# Patient Record
Sex: Male | Born: 1935 | Race: White | Hispanic: No | Marital: Married | Smoking: Never smoker
Health system: Southern US, Community
[De-identification: ages and names within clinical notes are randomized; demographics above are authoritative.]

## PROBLEM LIST (undated history)

## (undated) DIAGNOSIS — F0390 Unspecified dementia without behavioral disturbance: Secondary | ICD-10-CM

## (undated) DIAGNOSIS — K219 Gastro-esophageal reflux disease without esophagitis: Secondary | ICD-10-CM

## (undated) DIAGNOSIS — K56609 Unspecified intestinal obstruction, unspecified as to partial versus complete obstruction: Secondary | ICD-10-CM

## (undated) DIAGNOSIS — C189 Malignant neoplasm of colon, unspecified: Secondary | ICD-10-CM

## (undated) HISTORY — PX: COLON RESECTION: SHX5231

---

## 1989-04-28 DIAGNOSIS — C189 Malignant neoplasm of colon, unspecified: Secondary | ICD-10-CM

## 1989-04-28 HISTORY — DX: Malignant neoplasm of colon, unspecified: C18.9

## 2007-08-29 DIAGNOSIS — K56609 Unspecified intestinal obstruction, unspecified as to partial versus complete obstruction: Secondary | ICD-10-CM

## 2007-08-29 HISTORY — DX: Unspecified intestinal obstruction, unspecified as to partial versus complete obstruction: K56.609

## 2008-03-28 ENCOUNTER — Emergency Department (HOSPITAL_COMMUNITY): Admission: EM | Admit: 2008-03-28 | Discharge: 2008-03-28 | Payer: Self-pay | Admitting: Emergency Medicine

## 2008-06-05 ENCOUNTER — Inpatient Hospital Stay (HOSPITAL_COMMUNITY): Admission: EM | Admit: 2008-06-05 | Discharge: 2008-06-09 | Payer: Self-pay | Admitting: Gynecologic Oncology

## 2011-01-10 NOTE — H&P (Signed)
NAME:  SHAQUILLE, JANES NO.:  [id number]   MEDICAL RECORD NO.:  [id number]          PATIENT TYPE:  INP   LOCATION:                               FACILITY:  [facility]   PHYSICIAN:  Gabrielle Dare. Janee Morn, M.D.DATE OF BIRTH:  1928/06/03   DATE OF ADMISSION:  06/05/2008  DATE OF DISCHARGE:                              HISTORY & PHYSICAL   CHIEF COMPLAINT:  Nausea and vomiting.   HISTORY OF PRESENT ILLNESS:  Mr. Goodrich is a very pleasant 77 year old  white male with a history of colon resection for colon cancer in the  1990s.  He also had previous exploratory laparotomy for small-bowel  obstruction.  His operations were done at [place], [place].  Yesterday  evening, he developed some progressive nausea and vomiting around 05:00  p.m.  He tried to take some laxatives, which has helped him in the past  when he had partial obstruction symptoms.  He did not improve, however,  and he came to [facility] Emergency Department for evaluation.  Abdominal series done here shows partial small-bowel obstruction.  NG  tube has been placed and approximately 1 liter of bilious output has  returned.  He has no abdominal pain whatsoever at this time.   PAST MEDICAL HISTORY:  1. Hypertension.  2. Colon cancer.   PAST SURGICAL HISTORY:  Colon resections and exploratory laparotomy for  small-bowel obstruction as described above.   SOCIAL HISTORY:  He does not smoke or drink alcohol.  He is retired from  [employer].   MEDICATIONS AT HOME:  Include Valium, Protonix, and hydrochlorothiazide.   ALLERGIES:  No known drug allergies.   REVIEW OF SYSTEMS:  For GI includes nausea, vomiting as above.  He  denies any abdominal pain.  He had a small bowel movement earlier today.  Remainder of the review of systems was unremarkable.   PHYSICAL EXAMINATION:  VITAL SIGNS:  Temperature 98.5, pulse 102,  respirations 16, blood pressure 169/79.  GENERAL:  He is awake and alert, in no  distress.  HEENT:  Pupils are equal.  Sclerae is clear.  Oral mucosa is moist.  NECK:  Supple with no tenderness.  PULMONARY:  Lungs are clear to auscultation.  No wheezing is heard.  Respiratory effort is normal.  CARDIAC:  Heart is regular.  No murmurs are heard and pulse is palpable  in the left chest.  There is no significant peripheral edema.  ABDOMEN:  Moderately distended.  Bowel sounds are present.  He has no  significant tenderness in any portion.  No organomegaly is noted on  palpation.  EXTREMITIES:  No deformity or tenderness.  GU:  Normal external male genitalia.  SKIN:  Warm and dry with no rashes.  NEUROLOGIC:  He is awake and alert.  Speech is fluent.  He follows  commands and moves all extremities.   LABORATORY STUDIES:  Sodium 135, potassium 3.5, chloride 98, BUN 25,  creatinine 1.3, glucose 190, hemoglobin 16.3.  Abdominal series x-rays  show partial small-bowel obstruction.   PLAN:  Will be to admit him to the hospital.  We will  check a CBC.  Continue NG tube to low intermittent suction.  We will resuscitate him  with IV fluids and follow him closely.  If he does not improve, he may  need exploratory laparotomy and lysis of adhesions.  Plan was discussed  in detail with the patient and his wife.      Gabrielle Dare Janee Morn, M.D.  Electronically Signed     BET/MEDQ  D:  06/05/2008  T:  06/05/2008  Job:  [id number]   cc:   Marjean Donna

## 2011-01-10 NOTE — Discharge Summary (Signed)
NAME:  Jose Knox, Jose Knox NO.:  [id number]   MEDICAL RECORD NO.:  [id number]          PATIENT TYPE:  INP   LOCATION:  5038                         FACILITY:  [facility]   PHYSICIAN:  Gabrielle Dare. Janee Morn, M.D.DATE OF BIRTH:  Apr 04, 1928   DATE OF ADMISSION:  06/05/2008  DATE OF DISCHARGE:  06/09/2008                               DISCHARGE SUMMARY   DISCHARGING PHYSICIAN:  Gabrielle Dare. Janee Morn, MD   CHIEF COMPLAINT AND REASON FOR ADMISSION:  Jose Knox is a 77 year old  male patient history of prior colon resection with a distal rectal  anastomosis due to colon cancer.  This was done in 1993.  Since that  time, the patient has had difficulty with fecal incontinence.  He has  also had a recurrent bowel obstruction issues.  He underwent a lysis of  adhesions in 1999, for small bowel obstruction.  Over the past 2 years,  he has had increased frequency and severity of small bowel obstruction  symptoms, most recent episode was in July.  He resides in [place],  [place], but came to [place] for treatment of this occurrence.  The  patient presented to [facility] ER where he was found to have symptoms  consistent with a bowel obstruction associated with increased pain,  bloating, nausea, and vomiting.  Plain x-rays did reveal distended loops  of small bowel, but also with some air in the colon consistent with a  partial small-bowel obstruction.  His abdomen was distended on initial  exam.  Bowel sounds were present and nontender.  He was admitted by Dr.  Janee Morn with a diagnosis of partial small-bowel obstruction, recurrent.   HOSPITAL COURSE:  The patient was admitted, empirically placed on IV  fluids, NG tube decompression with plans to follow his symptoms  clinically.  Over the next several days, the patient continued to  improve rapidly.  By hospital day #3, the patient's NG tube was  discontinued.  His Foley was discontinued.  He was started on a clear  liquid diet.  He had been  passing bowel movements and flatus.  By  hospital day #4, his diet was advanced to full liquids with plans to  advance later to a low-residue diet and teaching regarding low-residue  diet, so the patient could continue this diet at home.  I discussed with  the patient's wife the patient's normal bowel regimen.  He does take  MiraLax p.r.n. based on issues related to lack of bowel movements.  They  did deny that the patient was taking any diuretic therapy, but the  patient is on hydrochlorothiazide presumably for hypertension issues and  in addition he does take Valium t.i.d., so these things are probably  contributing to obstipation and motility issues, which can contribute to  a bowel obstruction.   On hospital day #5, the patient was tolerating a low-residue diet by the  morning.  Teaching was still pending for the family regarding a low-  residue diet prior to discharge.  His vital signs were stable.  He was  afebrile and was satting 99% on room air and he was eating a  low-residue  diet upon my evaluation.  His abdomen was soft, nontender, and  nondistended with bowel sounds present.  Plans were to discharge the  patient after lunch as long as he continues to tolerate diet without any  abdominal symptoms.   When the patient presented, he did have a low-grade white count of  14,300.  This continued to trend down and by June 07, 2008, the  patient's hemoglobin was 14.  It was 16 at presentation.  White count  was 9200 and platelet count was 116,000.  The patient's most recent  electrolyte panel was checked on June 06, 2008, sodium was 140,  potassium 4.5, CO2 26, and creatinine 0.96.   As noted, as long as the patient tolerates diet advance with no  symptoms, he will be discharged home today, and he will follow up with  his primary care physician in [place] and most likely surgical group in  [place], [place] if bowel obstructive symptoms continue.   DISCHARGE DIAGNOSES:   1. Partial small-bowel obstruction recurrent.  2. Possible underlying mild obstipation etiology.  3. Hypertension, on diuretic therapy for blood pressure control.  4. Chronic benzodiazepine use, uncertain etiology, question underlying      anxiety disorder.  5. Suspected underlying dementia, based on clinical findings during      this admission most notable the patient's lack of short-term      memory.   DISCHARGE MEDICATIONS:  The patient will resume the following home  medications:  1. Valium 5 mg t.i.d.  2. Protonix 40 mg daily.  3. Hydrochlorothiazide 12.5 mg daily.   The medications are as follows and are all over-the-counter:  1. MiraLax 17 g and 8 ounces of fluid daily at hour of sleep.  2. Colace/docusate sodium 100 mg daily at hour of sleep, may increase      to 2 times daily to keep stools soft and regular, hold if he      develops loose or watery stools, do not start this medication until      this coming Friday.   ADDITIONAL DISCHARGE INSTRUCTIONS:  Diet, low residue.  Wound care, not  applicable.  In addition regarding the diet, I left this out, drink  plenty of water daily, 2-3 glasses.  Activity, no restrictions.   ADDITIONAL INSTRUCTIONS:  It is noted that the patient is taking  hydrochlorothiazide for blood pressure.  It is written on his discharge  instructions that this is a fluid pill, and the patient may need to talk  to his primary care physician to see if an alternative medication  besides a diuretic can be utilized to treat the patient's blood  pressure.  We request this to avoid additional fluid losses and to help  keep stool soft to avoid any future potential obstipation-related bowel  obstruction issues.   FOLLOW UP:  1. He has been instructed to follow up with his family doctor in      [place], call to be seen in 2-4 weeks.  Bring pink sheet from      discharge and medication reconciliation sheet with you.  2. If you choose to follow up with a  surgeon, especially regarding      this issue, you may follow up with Dr. Janee Morn, telephone number      [phone] if you have additional problems related to bowel      obstruction or other surgical issues.      Allison L. Rennis Harding, N.P.      Gabrielle Dare  Janee Morn, M.D.  Electronically Signed    ALE/MEDQ  D:  06/09/2008  T:  06/09/2008  Job:  [id number]

## 2011-05-30 LAB — CBC
HCT: 41.2
Hemoglobin: 14.1
Hemoglobin: 15.2
MCHC: 34.2
MCV: 93.1
MCV: 93.5
RBC: 4.37
RBC: 4.42
RDW: 12.6
RDW: 13.3
WBC: 9.2

## 2011-05-30 LAB — URINALYSIS, ROUTINE W REFLEX MICROSCOPIC
Ketones, ur: 15 — AB
Protein, ur: NEGATIVE
Specific Gravity, Urine: 1.021
Urobilinogen, UA: 1
pH: 7

## 2011-05-30 LAB — POCT I-STAT, CHEM 8
BUN: 25 — ABNORMAL HIGH
Calcium, Ion: 1.06 — ABNORMAL LOW
Creatinine, Ser: 1.3
Hemoglobin: 16.3
Sodium: 135
TCO2: 27

## 2011-05-30 LAB — BASIC METABOLIC PANEL
BUN: 20
CO2: 26
Creatinine, Ser: 0.96
GFR calc non Af Amer: >60
Glucose, Bld: 91
Potassium: 4.5

## 2011-05-30 LAB — HEMOGLOBIN AND HEMATOCRIT, BLOOD: HCT: 46.8

## 2011-05-30 LAB — DIFFERENTIAL: Basophils Absolute: 0

## 2011-05-30 LAB — OCCULT BLOOD X 1 CARD TO LAB, STOOL: Fecal Occult Bld: POSITIVE

## 2014-01-30 ENCOUNTER — Emergency Department (HOSPITAL_COMMUNITY): Payer: Medicare Other

## 2014-01-30 ENCOUNTER — Encounter (HOSPITAL_COMMUNITY): Payer: Self-pay | Admitting: Emergency Medicine

## 2014-01-30 ENCOUNTER — Inpatient Hospital Stay (HOSPITAL_COMMUNITY)
Admission: EM | Admit: 2014-01-30 | Discharge: 2014-02-03 | DRG: 389 | Disposition: A | Discharge status: Home Health | Payer: Medicare Other | Attending: Internal Medicine | Admitting: Internal Medicine

## 2014-01-30 DIAGNOSIS — R32 Unspecified urinary incontinence: Secondary | ICD-10-CM

## 2014-01-30 DIAGNOSIS — K56609 Unspecified intestinal obstruction, unspecified as to partial versus complete obstruction: Principal | ICD-10-CM

## 2014-01-30 DIAGNOSIS — R112 Nausea with vomiting, unspecified: Secondary | ICD-10-CM

## 2014-01-30 DIAGNOSIS — R739 Hyperglycemia, unspecified: Secondary | ICD-10-CM

## 2014-01-30 DIAGNOSIS — D696 Thrombocytopenia, unspecified: Secondary | ICD-10-CM

## 2014-01-30 DIAGNOSIS — F0390 Unspecified dementia without behavioral disturbance: Secondary | ICD-10-CM

## 2014-01-30 DIAGNOSIS — C189 Malignant neoplasm of colon, unspecified: Secondary | ICD-10-CM

## 2014-01-30 DIAGNOSIS — R7309 Other abnormal glucose: Secondary | ICD-10-CM | POA: Diagnosis present

## 2014-01-30 DIAGNOSIS — R195 Other fecal abnormalities: Secondary | ICD-10-CM

## 2014-01-30 DIAGNOSIS — Z85038 Personal history of other malignant neoplasm of large intestine: Secondary | ICD-10-CM

## 2014-01-30 DIAGNOSIS — R197 Diarrhea, unspecified: Secondary | ICD-10-CM

## 2014-01-30 DIAGNOSIS — N390 Urinary tract infection, site not specified: Secondary | ICD-10-CM

## 2014-01-30 DIAGNOSIS — D72829 Elevated white blood cell count, unspecified: Secondary | ICD-10-CM

## 2014-01-30 DIAGNOSIS — R509 Fever, unspecified: Secondary | ICD-10-CM

## 2014-01-30 DIAGNOSIS — K219 Gastro-esophageal reflux disease without esophagitis: Secondary | ICD-10-CM | POA: Diagnosis present

## 2014-01-30 DIAGNOSIS — I10 Essential (primary) hypertension: Secondary | ICD-10-CM

## 2014-01-30 HISTORY — DX: Unspecified intestinal obstruction, unspecified as to partial versus complete obstruction: K56.609

## 2014-01-30 HISTORY — DX: Unspecified dementia, unspecified severity, without behavioral disturbance, psychotic disturbance, mood disturbance, and anxiety: F03.90

## 2014-01-30 HISTORY — DX: Malignant neoplasm of colon, unspecified: C18.9

## 2014-01-30 HISTORY — DX: Gastro-esophageal reflux disease without esophagitis: K21.9

## 2014-01-30 LAB — COMPREHENSIVE METABOLIC PANEL
ALT: 22 U/L (ref 0–53)
AST: 29 U/L (ref 0–37)
Albumin: 4.1 g/dL (ref 3.5–5.2)
Alkaline Phosphatase: 67 U/L (ref 39–117)
BILIRUBIN TOTAL: 1.3 mg/dL — AB (ref 0.3–1.2)
BUN: 17 mg/dL (ref 6–23)
CHLORIDE: 97 meq/L (ref 96–112)
CO2: 27 meq/L (ref 19–32)
Calcium: 9.8 mg/dL (ref 8.4–10.5)
Creatinine, Ser: 0.9 mg/dL (ref 0.50–1.35)
GFR calc Af Amer: 89 mL/min — ABNORMAL LOW (ref >90)
GFR calc non Af Amer: 77 mL/min — ABNORMAL LOW (ref >90)
GLUCOSE: 139 mg/dL — AB (ref 70–99)
POTASSIUM: 4.2 meq/L (ref 3.7–5.3)
SODIUM: 137 meq/L (ref 137–147)
Total Protein: 7.3 g/dL (ref 6.0–8.3)

## 2014-01-30 LAB — CBC WITH DIFFERENTIAL/PLATELET
BASOS PCT: 0 % (ref 0–1)
Basophils Absolute: 0 10*3/uL (ref 0.0–0.1)
EOS ABS: 0 10*3/uL (ref 0.0–0.7)
Eosinophils Relative: 0 % (ref 0–5)
HCT: 45.8 % (ref 39.0–52.0)
Hemoglobin: 16.2 g/dL (ref 13.0–17.0)
LYMPHS PCT: 9 % — AB (ref 12–46)
Lymphs Abs: 1.2 10*3/uL (ref 0.7–4.0)
MCH: 32.2 pg (ref 26.0–34.0)
MCHC: 35.4 g/dL (ref 30.0–36.0)
MCV: 91.1 fL (ref 78.0–100.0)
MONO ABS: 1.2 10*3/uL — AB (ref 0.1–1.0)
Monocytes Relative: 9 % (ref 3–12)
NEUTROS PCT: 82 % — AB (ref 43–77)
Neutro Abs: 11.1 10*3/uL — ABNORMAL HIGH (ref 1.7–7.7)
Platelets: 130 10*3/uL — ABNORMAL LOW (ref 150–400)
RBC: 5.03 MIL/uL (ref 4.22–5.81)
RDW: 12.7 % (ref 11.5–15.5)
WBC: 13.5 10*3/uL — ABNORMAL HIGH (ref 4.0–10.5)

## 2014-01-30 LAB — URINALYSIS, ROUTINE W REFLEX MICROSCOPIC
BILIRUBIN URINE: NEGATIVE
GLUCOSE, UA: NEGATIVE mg/dL
Ketones, ur: NEGATIVE mg/dL
LEUKOCYTES UA: NEGATIVE
NITRITE: NEGATIVE
PH: 5.5 (ref 5.0–8.0)
Protein, ur: 30 mg/dL — AB
SPECIFIC GRAVITY, URINE: 1.015 (ref 1.005–1.030)
Urobilinogen, UA: 0.2 mg/dL (ref 0.0–1.0)

## 2014-01-30 LAB — URINE MICROSCOPIC-ADD ON

## 2014-01-30 LAB — CEA: CEA: 1.1 ng/mL (ref 0.0–5.0)

## 2014-01-30 LAB — SAMPLE TO BLOOD BANK

## 2014-01-30 LAB — POC OCCULT BLOOD, ED: Fecal Occult Bld: POSITIVE — AB

## 2014-01-30 LAB — GLUCOSE, CAPILLARY
Glucose-Capillary: 109 mg/dL — ABNORMAL HIGH (ref 70–99)
Glucose-Capillary: 141 mg/dL — ABNORMAL HIGH (ref 70–99)

## 2014-01-30 LAB — TROPONIN I

## 2014-01-30 LAB — LACTIC ACID, PLASMA: LACTIC ACID, VENOUS: 2.6 mmol/L — AB (ref 0.5–2.2)

## 2014-01-30 LAB — LIPASE, BLOOD: Lipase: 12 U/L (ref 11–59)

## 2014-01-30 MED ORDER — MORPHINE SULFATE 2 MG/ML IJ SOLN: 1.0000 mg | INTRAMUSCULAR | Status: DC | PRN

## 2014-01-30 MED ORDER — SODIUM CHLORIDE 0.9 % IV SOLN: INTRAVENOUS | Status: AC

## 2014-01-30 MED ORDER — GI COCKTAIL ~~LOC~~
30.0000 mL | Freq: Three times a day (TID) | ORAL | Status: DC | PRN
  Administered 2014-01-30: 30 mL via ORAL
  Filled 2014-01-30: qty 30

## 2014-01-30 MED ORDER — INSULIN ASPART 100 UNIT/ML ~~LOC~~ SOLN
0.0000 [IU] | Freq: Three times a day (TID) | SUBCUTANEOUS | Status: DC
  Administered 2014-02-02 – 2014-02-03 (×3): 1 [IU] via SUBCUTANEOUS

## 2014-01-30 MED ORDER — METOPROLOL TARTRATE 1 MG/ML IV SOLN
5.0000 mg | Freq: Three times a day (TID) | INTRAVENOUS | Status: DC
  Administered 2014-01-30 – 2014-02-02 (×9): 5 mg via INTRAVENOUS
  Filled 2014-01-30 (×9): qty 5

## 2014-01-30 MED ORDER — METOCLOPRAMIDE HCL 5 MG/ML IJ SOLN
5.0000 mg | Freq: Once | INTRAMUSCULAR | Status: AC
  Administered 2014-01-30: 5 mg via INTRAVENOUS
  Filled 2014-01-30: qty 2

## 2014-01-30 MED ORDER — IOHEXOL 300 MG/ML  SOLN
80.0000 mL | Freq: Once | INTRAMUSCULAR | Status: AC | PRN
  Administered 2014-01-30: 100 mL via INTRAVENOUS

## 2014-01-30 MED ORDER — IOHEXOL 300 MG/ML  SOLN
50.0000 mL | Freq: Once | INTRAMUSCULAR | Status: AC | PRN
  Administered 2014-01-30: 50 mL via ORAL

## 2014-01-30 MED ORDER — HYDRALAZINE HCL 20 MG/ML IJ SOLN
5.0000 mg | Freq: Three times a day (TID) | INTRAMUSCULAR | Status: DC | PRN
  Administered 2014-01-30 – 2014-02-01 (×2): 5 mg via INTRAVENOUS
  Filled 2014-01-30 (×2): qty 1

## 2014-01-30 MED ORDER — ONDANSETRON HCL 4 MG PO TABS: 4.0000 mg | ORAL_TABLET | Freq: Four times a day (QID) | ORAL | Status: DC | PRN

## 2014-01-30 MED ORDER — SODIUM CHLORIDE 0.9 % IV SOLN
INTRAVENOUS | Status: DC
  Administered 2014-01-30: 11:00:00 via INTRAVENOUS

## 2014-01-30 MED ORDER — PNEUMOCOCCAL VAC POLYVALENT 25 MCG/0.5ML IJ INJ
0.5000 mL | INJECTION | INTRAMUSCULAR | Status: AC
  Administered 2014-01-31: 0.5 mL via INTRAMUSCULAR
  Filled 2014-01-30: qty 0.5

## 2014-01-30 MED ORDER — SODIUM CHLORIDE 0.9 % IV SOLN
INTRAVENOUS | Status: DC
  Administered 2014-01-30 – 2014-02-01 (×3): via INTRAVENOUS

## 2014-01-30 MED ORDER — ENOXAPARIN SODIUM 40 MG/0.4ML ~~LOC~~ SOLN
40.0000 mg | SUBCUTANEOUS | Status: DC
  Administered 2014-01-30 – 2014-02-02 (×4): 40 mg via SUBCUTANEOUS
  Filled 2014-01-30 (×4): qty 0.4

## 2014-01-30 MED ORDER — SODIUM CHLORIDE 0.9 % IV SOLN
12.5000 mg | Freq: Four times a day (QID) | INTRAVENOUS | Status: DC | PRN
  Administered 2014-01-30: 12.5 mg via INTRAVENOUS
  Filled 2014-01-30 (×2): qty 0.5

## 2014-01-30 MED ORDER — ACETAMINOPHEN 650 MG RE SUPP: 650.0000 mg | Freq: Four times a day (QID) | RECTAL | Status: DC | PRN

## 2014-01-30 MED ORDER — ONDANSETRON HCL 4 MG/2ML IJ SOLN
4.0000 mg | Freq: Four times a day (QID) | INTRAMUSCULAR | Status: DC | PRN
  Administered 2014-01-30: 4 mg via INTRAVENOUS
  Filled 2014-01-30: qty 2

## 2014-01-30 MED ORDER — ACETAMINOPHEN 325 MG PO TABS
650.0000 mg | ORAL_TABLET | Freq: Four times a day (QID) | ORAL | Status: DC | PRN
  Administered 2014-01-31 – 2014-02-02 (×4): 650 mg via ORAL
  Filled 2014-01-30 (×4): qty 2

## 2014-01-30 MED ORDER — PANTOPRAZOLE SODIUM 40 MG IV SOLR
40.0000 mg | INTRAVENOUS | Status: DC
  Administered 2014-01-30 – 2014-01-31 (×2): 40 mg via INTRAVENOUS
  Filled 2014-01-30 (×2): qty 40

## 2014-01-30 NOTE — H&P (Signed)
Have seen and assessed patient and I agree with Dyanne Carrel, NP assessment and plan. Chart has been reviewed. Labs have been reviewed.  Patient with a history of dementia, colon cancer status post resection in the 1990s, history of small bowel resection, dysphagia status post esophageal dilatation presenting to the ED with persistent nausea vomiting and diarrhea. CT abdomen and pelvis obtained consistent with partial small bowel obstruction likely secondary to adhesions. Patient with no current emesis. Patient with hiccups. Will admit patient to MedSurg floor. Keep n.p.o., IV fluids, antiemetics, pain management, supportive care. General surgery has been consulted. Follow.

## 2014-01-30 NOTE — H&P (Signed)
[facility] History and Physical  SAJID RUPPERT WIO:[id number] DOB: [birth year] DOA: 01/30/2014  Referring physician:  PCP: Su Monks, MD   Chief Complaint: Nausea vomiting diarrhea  HPI: Jose Knox is a very pleasant 80 y.o. male with a history of dementia, colon cancer status post resection in the 1990s, small bowel obstruction, dysphasia status post esophageal dilatation, GERD presents to the hospital from home with the chief complaint of persistent nausea vomiting diarrhea. Information is obtained from the patient and his wife is at the bedside. Of note patient with mild dementia therefore information from him is somewhat unreliable. Wife reports that 3 days ago he developed gradual onset of nausea vomiting and diarrhea. During this time he's been unable to tolerate his food. In addition he said "several" episodes of very loose stool that wife describes as "dark [place] P[place]ene." In addition he's had intermittent episodes of emesis. She denies any coffee ground emesis. Other associated symptoms include generalized weakness abdominal distention. He denies abdominal pain chest pain shortness of breath fever chills. He does complain of hiccups. He denies dysuria hematuria frequency or urgency. Initial evaluation in the emergency department includes a CT of the abdomen and pelvis which reveals a small bowel obstruction and proximal to mid ileum likely related to adhesions.  The time of my exam he is alert and oriented to self and place only. He denies abdominal pain. He is hemodynamically stable afebrile and not hypoxic.   Review of Systems:  10 point review of systems completed with patient and his wife. All systems are negative except as indicated in the history of present illness   Past Medical History  Diagnosis Date  . Dementia   . Small bowel obstruction 2009  . Colon cancer 1990's   Past Surgical History  Procedure Laterality Date  . Colon resection      multiple surgeries in  [place], [place]   Social History:  reports that he has never smoked. He does not have any smokeless tobacco history on file. He reports that he does not drink alcohol or use illicit drugs. Patient lives with his wife. He ambulates without assistance. He is fairly independent with ADLs. He does have short-term memory issues. He can let his needs and wants been known. No recent falls No Known Allergies  History reviewed. No pertinent family history.   Prior to Admission medications   Medication Sig Start Date End Date Taking? Authorizing Provider  diazepam (VALIUM) 5 MG tablet Take 1 tablet by mouth at bedtime. 12/27/13  Yes Historical Provider, MD  Ergocalciferol (VITAMIN D2) 2000 UNITS TABS Take 1 tablet by mouth daily.   Yes Historical Provider, MD  pantoprazole (PROTONIX) 20 MG tablet Take 1 tablet by mouth 2 (two) times daily. 12/03/13  Yes Historical Provider, MD   Physical Exam: Filed Vitals:   01/30/14 1203  BP: 159/67  Pulse: 77  Temp:   Resp: 20    BP 159/67  Pulse 77  Temp(Src) 98.2 F (36.8 C) (Oral)  Resp 20  Ht 5\' 10"  (1.778 m)  Wt 70.308 kg (155 lb)  BMI 22.24 kg/m2  SpO2 99%  General:  Appears calm and comfortable Eyes: PERRL, normal lids, irises & conjunctiva ENT: grossly normal hearing, lips & tongue Neck: no LAD, masses or thyromegaly Cardiovascular: RRR, no m/r/g. No LE edema. Pedal pulses present and palpable Respiratory: CTA bilaterally, no w/r/r. Normal respiratory effort. Abdomen: soft, positive bowel sounds but sluggish, slightly distended. Nontender to palpation Skin: no rash or induration seen on  limited exam Musculoskeletal: grossly normal tone BUE/BLE Psychiatric: grossly normal mood and affect, speech fluent and appropriate Neurologic: grossly non-focal. Speech is clear facial symmetry bilateral grip 5 out of 5 follows commands . Oriented to self and place only           Labs on Admission:  Basic Metabolic Panel:  Recent Labs Lab 01/30/14 1115    NA 137  K 4.2  CL 97  CO2 27  GLUCOSE 139*  BUN 17  CREATININE 0.90  CALCIUM 9.8   Liver Function Tests:  Recent Labs Lab 01/30/14 1115  AST 29  ALT 22  ALKPHOS 67  BILITOT 1.3*  PROT 7.3  ALBUMIN 4.1    Recent Labs Lab 01/30/14 1115  LIPASE 12   No results found for this basename: AMMONIA,  in the last 168 hours CBC:  Recent Labs Lab 01/30/14 1115  WBC 13.5*  NEUTROABS 11.1*  HGB 16.2  HCT 45.8  MCV 91.1  PLT 130*   Cardiac Enzymes:  Recent Labs Lab 01/30/14 1115  TROPONINI <0.30    BNP (last 3 results) No results found for this basename: PROBNP,  in the last 8760 hours CBG: No results found for this basename: GLUCAP,  in the last 168 hours  Radiological Exams on Admission: Dg Chest 2 View  01/30/2014   CLINICAL DATA:  Nausea and vomiting  EXAM: CHEST  2 VIEW  COMPARISON:  None.  FINDINGS: The cardiac shadow is within normal limits. The lungs are clear bilaterally. An air-fluid level is noted within the gastric lumen. No gross bony abnormality is noted.  IMPRESSION: No active cardiopulmonary disease.   Electronically Signed   By: Inez Catalina M.D.   On: 01/30/2014 11:57   Ct Abdomen Pelvis W Contrast  01/30/2014   CLINICAL DATA:  History of colon carcinoma an abdominal pain  EXAM: CT ABDOMEN AND PELVIS WITH CONTRAST  TECHNIQUE: Multidetector CT imaging of the abdomen and pelvis was performed using the standard protocol following bolus administration of intravenous contrast.  CONTRAST:  96mL OMNIPAQUE IOHEXOL 300 MG/ML SOLN, 160mL OMNIPAQUE IOHEXOL 300 MG/ML SOLN  COMPARISON:  None.  FINDINGS: Lung bases are free of acute infiltrate or sizable effusion. Atherosclerotic changes are noted in the descending thoracic aorta as well as within the abdominal aorta.  The liver, gallbladder, spleen, pancreas and adrenal glands are within normal limits. Kidneys are well visualized bilaterally and reveal renal cystic change on the right. No calculi or obstructive changes  are seen.  There is significant distention of the stomach, jejunum and proximal ileum. An area of relative caliber change is noted in the proximal to mid ileum best seen on image number 82 of series 2. These changes are likely related to a adhesions from patient's known prior surgery as no definitive mass lesion is seen. The more distal ileum is fluid filled but otherwise within normal limits. These changes are consistent with a partial small bowel obstruction.  The bladder is well distended. No pelvic mass lesion or sidewall abnormality is seen. A small amount of free pelvic fluid is noted. Postsurgical changes are noted in the rectum. The osseous structures show no acute abnormality. 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  IMPRESSION: Partial small bowel obstruction in the proximal to mid ileum likely related to adhesions.  No other acute abnormality is seen.   Electronically Signed   By: Inez Catalina M.D.   On: 01/30/2014 12:42    EKG:   Assessment/Plan  Principal Problem:   Small bowel obstruction: I clearly related to adhesions from his resection in the 1990s. Patient has had this before several years ago. Will admit. Provide bowel rest and supportive therapy. Will keep n.p.o. for now. Hold off on NG tube for now. If no improvement or he begins to feel worse will provide NG tube. Requested general surgery consult. Active Problems: Nausea vomiting and diarrhea: Likely related to above. Will keep n.p.o. Will obtain stool sample for GI pathogen panel. No recent illness or antibiotics. Monitor electrolytes. Reported therapy    Thrombocytopenia: Chart review indicates this is somewhat chronic. Most recent lab work 5 years ago and platelets at that time were less than they are now. No obvious signs of bleeding. Will monitor. May benefit from outpatient workup    Leukocytosis: Related to #1. Await urinalysis. Chest x-ray unremarkable. He is afebrile and not toxic-appearing. Will  monitor    Hyperglycemia: Mild. No history of diabetes. Will obtain hemoglobin A1c. Will monitor closely    Dementia: Appears to be stable at baseline. Will monitor      Heme positive stool: Etiology unclear. He is not currently anemic. Once acute illness resolved. GI followup recommended  Dr. Arnoldo Morale general surgery Code Status: full Family Communication: wife at bedside Disposition Plan: home when ready  Time spent: 36 minutes  [facility] Pager [phone]  **Disclaimer: This note may have been dictated with voice recognition software. Similar sounding words can inadvertently be transcribed and this note may contain transcription errors which may not have been corrected upon publication of note.**

## 2014-01-30 NOTE — ED Notes (Signed)
Called unit 300 for reports. Nurse states she would call back due to possible room change.

## 2014-01-30 NOTE — ED Provider Notes (Signed)
CSN: [id number]     Arrival date & time 01/30/14  0945 History   First MD Initiated Contact with Patient 01/30/14 [phone]     Chief Complaint  Patient presents with  . Emesis      Patient is a 80 y.o. male presenting with vomiting. The history is provided by the patient and the spouse. The history is limited by the condition of the patient (Hx dementia).  Emesis Pt was seen at 1030. Per pt and his wife: c/o gradual onset and persistence of multiple intermittent episodes of N/V/D for the past 2 days. Pt's wife describes pt's stools as "dark black." Pt has been unable to tol PO due to his symptoms. Pt's wife states "he was like this the last time he had a bowel obstruction" and she is concerned for same today. Pt has significant hx of dementia; currently denies CP/SOB, no abd pain, no back pain.     Past Medical History  Diagnosis Date  . Dementia   . Small bowel obstruction 2009  . Colon cancer 1990's   Past Surgical History  Procedure Laterality Date  . Colon resection      multiple surgeries in [place], [place]    History  Substance Use Topics  . Smoking status: Never Smoker   . Smokeless tobacco: Not on file  . Alcohol Use: No    Review of Systems  Unable to perform ROS: Dementia  Gastrointestinal: Positive for vomiting.      Allergies  Review of patient's allergies indicates no known allergies.  Home Medications   Prior to Admission medications   Medication Sig Start Date End Date Taking? Authorizing Provider  diazepam (VALIUM) 5 MG tablet Take 1 tablet by mouth at bedtime. 12/27/13  Yes Historical Provider, MD  Ergocalciferol (VITAMIN D2) 2000 UNITS TABS Take 1 tablet by mouth daily.   Yes Historical Provider, MD  pantoprazole (PROTONIX) 20 MG tablet Take 1 tablet by mouth 2 (two) times daily. 12/03/13  Yes Historical Provider, MD   BP 159/67  Pulse 77  Temp(Src) 98.2 F (36.8 C) (Oral)  Resp 20  Ht 5\' 10"  (1.778 m)  Wt 155 lb (70.308 kg)  BMI 22.24 kg/m2  SpO2  99% Physical Exam 1035: Physical examination:  Nursing notes reviewed; Vital signs and O2 SAT reviewed;  Constitutional: Well developed, Well nourished, In no acute distress; Head:  Normocephalic, atraumatic; Eyes: EOMI, PERRL, No scleral icterus; ENMT: Mouth and pharynx normal, Mucous membranes dry;; Neck: Supple, Full range of motion, No lymphadenopathy; Cardiovascular: Regular rate and rhythm, No gallop; Respiratory: Breath sounds clear & equal bilaterally, No wheezes.  Speaking full sentences with ease, Normal respiratory effort/excursion; Chest: Nontender, Movement normal; Abdomen: +mild diffuse tenderness to palp, +softly distended. Decreased bowel sounds. Rectal exam performed w/permission of pt and ED RN chaperone present.  Anal tone normal.  Non-tender, soft brown stool in rectal vault, heme positive.  No fissures, no external hemorrhoids, no palp masses.; Genitourinary: No CVA tenderness; Extremities: Pulses normal, No tenderness, No edema, No calf edema or asymmetry.; Neuro: Awake, alert, confused re: time, place, events per hx dementia. Major CN grossly intact.  Speech clear. Moves all extremities spontaneously and to command without apparent gross focal motor deficits.; Skin: Color normal, Warm, Dry.   ED Course  Procedures     EKG Interpretation   Date/Time:  Friday January 30 2014 11:32:17 EDT Ventricular Rate:  67 PR Interval:  129 QRS Duration: 107 QT Interval:  386 QTC Calculation: 407 R Axis:  96 Text Interpretation:  Sinus rhythm Inferior infarct, old Baseline wander  When compared with ECG of 06/05/2008 No significant change was found  Confirmed by [facility]  MD, Nunzio Cory [phone]) on 01/30/2014 12:45:18 PM      MDM  MDM Reviewed: previous chart, nursing note and vitals Reviewed previous: labs and ECG Interpretation: labs, ECG, x-ray and CT scan    Results for orders placed during the hospital encounter of 01/30/14  CBC WITH DIFFERENTIAL      Result Value Ref Range    WBC 13.5 (*) 4.0 - 10.5 K/uL   RBC 5.03  4.22 - 5.81 MIL/uL   Hemoglobin 16.2  13.0 - 17.0 g/dL   HCT 45.8  39.0 - 52.0 %   MCV 91.1  78.0 - 100.0 fL   MCH 32.2  26.0 - 34.0 pg   MCHC 35.4  30.0 - 36.0 g/dL   RDW 12.7  11.5 - 15.5 %   Platelets 130 (*) 150 - 400 K/uL   Neutrophils Relative % 82 (*) 43 - 77 %   Neutro Abs 11.1 (*) 1.7 - 7.7 K/uL   Lymphocytes Relative 9 (*) 12 - 46 %   Lymphs Abs 1.2  0.7 - 4.0 K/uL   Monocytes Relative 9  3 - 12 %   Monocytes Absolute 1.2 (*) 0.1 - 1.0 K/uL   Eosinophils Relative 0  0 - 5 %   Eosinophils Absolute 0.0  0.0 - 0.7 K/uL   Basophils Relative 0  0 - 1 %   Basophils Absolute 0.0  0.0 - 0.1 K/uL  COMPREHENSIVE METABOLIC PANEL      Result Value Ref Range   Sodium 137  137 - 147 mEq/L   Potassium 4.2  3.7 - 5.3 mEq/L   Chloride 97  96 - 112 mEq/L   CO2 27  19 - 32 mEq/L   Glucose, Bld 139 (*) 70 - 99 mg/dL   BUN 17  6 - 23 mg/dL   Creatinine, Ser 0.90  0.50 - 1.35 mg/dL   Calcium 9.8  8.4 - 10.5 mg/dL   Total Protein 7.3  6.0 - 8.3 g/dL   Albumin 4.1  3.5 - 5.2 g/dL   AST 29  0 - 37 U/L   ALT 22  0 - 53 U/L   Alkaline Phosphatase 67  39 - 117 U/L   Total Bilirubin 1.3 (*) 0.3 - 1.2 mg/dL   GFR calc non Af Amer 77 (*) >90 mL/min   GFR calc Af Amer 89 (*) >90 mL/min  LIPASE, BLOOD      Result Value Ref Range   Lipase 12  11 - 59 U/L  LACTIC ACID, PLASMA      Result Value Ref Range   Lactic Acid, Venous 2.6 (*) 0.5 - 2.2 mmol/L  TROPONIN I      Result Value Ref Range   Troponin I <0.30  <0.30 ng/mL  POC OCCULT BLOOD, ED      Result Value Ref Range   Fecal Occult Bld POSITIVE (*) NEGATIVE  SAMPLE TO BLOOD BANK      Result Value Ref Range   Blood Bank Specimen SAMPLE AVAILABLE FOR TESTING     Sample Expiration 02/02/2014     Dg Chest 2 View 01/30/2014   CLINICAL DATA:  Nausea and vomiting  EXAM: CHEST  2 VIEW  COMPARISON:  None.  FINDINGS: The cardiac shadow is within normal limits. The lungs are clear bilaterally. An air-fluid  level is noted  within the gastric lumen. No gross bony abnormality is noted.  IMPRESSION: No active cardiopulmonary disease.   Electronically Signed   By: Inez Catalina M.D.   On: 01/30/2014 11:57   Ct Abdomen Pelvis W Contrast 01/30/2014   CLINICAL DATA:  History of colon carcinoma an abdominal pain  EXAM: CT ABDOMEN AND PELVIS WITH CONTRAST  TECHNIQUE: Multidetector CT imaging of the abdomen and pelvis was performed using the standard protocol following bolus administration of intravenous contrast.  CONTRAST:  17mL OMNIPAQUE IOHEXOL 300 MG/ML SOLN, 164mL OMNIPAQUE IOHEXOL 300 MG/ML SOLN  COMPARISON:  None.  FINDINGS: Lung bases are free of acute infiltrate or sizable effusion. Atherosclerotic changes are noted in the descending thoracic aorta as well as within the abdominal aorta.  The liver, gallbladder, spleen, pancreas and adrenal glands are within normal limits. Kidneys are well visualized bilaterally and reveal renal cystic change on the right. No calculi or obstructive changes are seen.  There is significant distention of the stomach, jejunum and proximal ileum. An area of relative caliber change is noted in the proximal to mid ileum best seen on image number 82 of series 2. These changes are likely related to a adhesions from patient's known prior surgery as no definitive mass lesion is seen. The more distal ileum is fluid filled but otherwise within normal limits. These changes are consistent with a partial small bowel obstruction.  The bladder is well distended. No pelvic mass lesion or sidewall abnormality is seen. A small amount of free pelvic fluid is noted. Postsurgical changes are noted in the rectum. The osseous structures show no acute abnormality. 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  IMPRESSION: Partial small bowel obstruction in the proximal to mid ileum likely related to adhesions.  No other acute abnormality is seen.   Electronically Signed   By: Inez Catalina M.D.    On: 01/30/2014 12:42    1330:  Pt has had several episodes of N/V (yellow emesis) while in the ED; will place NGT. No stooling while in the ED. Dx and testing d/w pt and family.  Questions answered.  Verb understanding, agreeable to admit.  T/C to [facility]. Grandville Silos, case discussed, including:  HPI, pertinent PM/SHx, VS/PE, dx testing, ED course and treatment:  Agreeable to admit, requests to write temporary orders, obtain medical bed to team 1. T/C to General Surgery Dr. Arnoldo Morale, case discussed, including:  HPI, pertinent PM/SHx, VS/PE, dx testing, ED course and treatment:  Agreeable to consult, requests to order CEA level.      Alfonzo Feller, DO 02/01/14 [phone]

## 2014-01-30 NOTE — ED Notes (Signed)
Report called to Janett Billow, Therapist, sports.  Transferring to 3rd floor.

## 2014-01-30 NOTE — ED Notes (Signed)
Vomiting this morning,  Denies any other symptoms.

## 2014-01-30 NOTE — ED Notes (Signed)
Pt's wife arrived to room and reports pt has had n/v/d since Wednesday. Wife states stool in "dark black". Wife also states pt has not been able to eat and drink "like normal" due to symptoms. Positive hemoccult test performed by Dr Thurnell Garbe at bedside. Pt is A&Ox4.  Nad.

## 2014-01-31 ENCOUNTER — Observation Stay (HOSPITAL_COMMUNITY): Payer: Medicare Other

## 2014-01-31 DIAGNOSIS — D72829 Elevated white blood cell count, unspecified: Secondary | ICD-10-CM

## 2014-01-31 LAB — CBC
HCT: 39.9 % (ref 39.0–52.0)
Hemoglobin: 13.8 g/dL (ref 13.0–17.0)
MCH: 31.7 pg (ref 26.0–34.0)
MCHC: 34.6 g/dL (ref 30.0–36.0)
MCV: 91.5 fL (ref 78.0–100.0)
Platelets: 127 10*3/uL — ABNORMAL LOW (ref 150–400)
RBC: 4.36 MIL/uL (ref 4.22–5.81)
RDW: 12.7 % (ref 11.5–15.5)
WBC: 9.2 10*3/uL (ref 4.0–10.5)

## 2014-01-31 LAB — BASIC METABOLIC PANEL WITH GFR
BUN: 19 mg/dL (ref 6–23)
CO2: 22 meq/L (ref 19–32)
Calcium: 8.1 mg/dL — ABNORMAL LOW (ref 8.4–10.5)
Chloride: 99 meq/L (ref 96–112)
Creatinine, Ser: 0.96 mg/dL (ref 0.50–1.35)
GFR calc Af Amer: 86 mL/min — ABNORMAL LOW
GFR calc non Af Amer: 75 mL/min — ABNORMAL LOW
Glucose, Bld: 101 mg/dL — ABNORMAL HIGH (ref 70–99)
Potassium: 3.8 meq/L (ref 3.7–5.3)
Sodium: 136 meq/L — ABNORMAL LOW (ref 137–147)

## 2014-01-31 LAB — URINE CULTURE

## 2014-01-31 LAB — GLUCOSE, CAPILLARY
GLUCOSE-CAPILLARY: 114 mg/dL — AB (ref 70–99)
GLUCOSE-CAPILLARY: 83 mg/dL (ref 70–99)
Glucose-Capillary: 95 mg/dL (ref 70–99)
Glucose-Capillary: 79 mg/dL (ref 70–99)

## 2014-01-31 LAB — CLOSTRIDIUM DIFFICILE BY PCR: Toxigenic C. Difficile by PCR: NEGATIVE

## 2014-01-31 LAB — HEMOGLOBIN A1C
Hgb A1c MFr Bld: 5.3 %
Mean Plasma Glucose: 105 mg/dL

## 2014-01-31 LAB — LACTIC ACID, PLASMA: Lactic Acid, Venous: 3.6 mmol/L — ABNORMAL HIGH (ref 0.5–2.2)

## 2014-01-31 LAB — MAGNESIUM: Magnesium: 1.6 mg/dL (ref 1.5–2.5)

## 2014-01-31 MED ORDER — MAGNESIUM SULFATE 50 % IJ SOLN
3.0000 g | Freq: Once | INTRAVENOUS | Status: DC
  Filled 2014-01-31: qty 6

## 2014-01-31 MED ORDER — MAGNESIUM SULFATE 40 MG/ML IJ SOLN
2.0000 g | Freq: Once | INTRAMUSCULAR | Status: AC
  Administered 2014-01-31: 2 g via INTRAVENOUS

## 2014-01-31 MED ORDER — MAGNESIUM SULFATE 40 MG/ML IJ SOLN
INTRAMUSCULAR | Status: AC
  Filled 2014-01-31: qty 50

## 2014-01-31 NOTE — Progress Notes (Signed)
[facility] PROGRESS NOTE  Jose Knox FKC:[id number] DOB: [birth year] DOA: 01/30/2014 PCP: Su Monks, MD  Assessment/Plan: #1 small bowel obstruction Patient with no nausea no vomiting no abdominal pain. Patient denies any flatus or bowel movement however endorses flatus and bowel movement to the general surgeon. KUB with no significant change. Patient has been started on clears per general surgery. IV fluids. Conservative treatment. Mobilized. Follow.  #2 nausea vomiting and diarrhea Likely secondary to problem #1. No further nausea or vomiting. Stool is negative for C. difficile PCR. IV fluids. Supportive care.  #3 leukocytosis Likely reactive leukocytosis secondary to problem #1. Leukocytosis has improved. Chest x-ray was negative for any acute infiltrate. Urinalysis is negative. No need for antibiotics at this time. Follow.  #4 thrombocytopenia Stable.  #5 dementia Stable.  #6 prophylaxis Protonix for GI prophylaxis. Lovenox for DVT prophylaxis.  Code Status: Full Family Communication: Updated patient and wife at bedside. Disposition Plan: Home when medically stable.   Consultants:  General surgery: Dr. Arnoldo Morale 01/31/2014  Procedures:  CT abdomen and pelvis 01/30/2014  KUB 01/31/2014  Chest x-ray 01/30/2014  Antibiotics:  None  HPI/Subjective: Patient denies any abdominal pain. No nausea. No vomiting. Patient states he can't have resolved. Patient initially states he passes flatus but then states he has not passed any flatus today. Patient denies any bowel movement today.  Objective: Filed Vitals:   01/31/14 1424  BP: 145/59  Pulse: 65  Temp: 98.5 F (36.9 C)  Resp: 16    Intake/Output Summary (Last 24 hours) at 01/31/14 1629 Last data filed at 01/31/14 1300  Gross per 24 hour  Intake   1540 ml  Output      8 ml  Net   1532 ml   Filed Weights   01/30/14 0955 01/30/14 1515  Weight: 70.308 kg (155 lb) 68.5 kg (151 lb 0.2 oz)     Exam:   General:  NAD  Cardiovascular: RRR  Respiratory: CTAB  Abdomen: Soft, nontender, nondistended, positive bowel sounds.  Musculoskeletal: No clubbing cyanosis or edema  Data Reviewed: Basic Metabolic Panel:  Recent Labs Lab 01/30/14 1115 01/31/14 0621  NA 137 136*  K 4.2 3.8  CL 97 99  CO2 27 22  GLUCOSE 139* 101*  BUN 17 19  CREATININE 0.90 0.96  CALCIUM 9.8 8.1*  MG  --  1.6   Liver Function Tests:  Recent Labs Lab 01/30/14 1115  AST 29  ALT 22  ALKPHOS 67  BILITOT 1.3*  PROT 7.3  ALBUMIN 4.1    Recent Labs Lab 01/30/14 1115  LIPASE 12   No results found for this basename: AMMONIA,  in the last 168 hours CBC:  Recent Labs Lab 01/30/14 1115 01/31/14 0621  WBC 13.5* 9.2  NEUTROABS 11.1*  --   HGB 16.2 13.8  HCT 45.8 39.9  MCV 91.1 91.5  PLT 130* 127*   Cardiac Enzymes:  Recent Labs Lab 01/30/14 1115  TROPONINI <0.30   BNP (last 3 results) No results found for this basename: PROBNP,  in the last 8760 hours CBG:  Recent Labs Lab 01/30/14 1728 01/30/14 2033 01/31/14 0739 01/31/14 1135  GLUCAP 109* 141* 114* 95    Recent Results (from the past 240 hour(s))  CLOSTRIDIUM DIFFICILE BY PCR     Status: None   Collection Time    01/31/14  2:08 AM      Result Value Ref Range Status   C difficile by pcr NEGATIVE  NEGATIVE Final  CULTURE, BLOOD (ROUTINE X  2)     Status: None   Collection Time    01/31/14  6:21 AM      Result Value Ref Range Status   Specimen Description BLOOD RIGHT ARM   Final   Special Requests BAA 8 CC EACH   Final   Culture PENDING   Incomplete   Report Status PENDING   Incomplete  CULTURE, BLOOD (ROUTINE X 2)     Status: None   Collection Time    01/31/14  6:21 AM      Result Value Ref Range Status   Specimen Description BLOOD LEFT HAND   Final   Special Requests  BAA 6 CC EACH   Final   Culture PENDING   Incomplete   Report Status PENDING   Incomplete     Studies: Dg Chest 2  View  01/30/2014   CLINICAL DATA:  Nausea and vomiting  EXAM: CHEST  2 VIEW  COMPARISON:  None.  FINDINGS: The cardiac shadow is within normal limits. The lungs are clear bilaterally. An air-fluid level is noted within the gastric lumen. No gross bony abnormality is noted.  IMPRESSION: No active cardiopulmonary disease.   Electronically Signed   By: Inez Catalina M.D.   On: 01/30/2014 11:57   Dg Abd 1 View  01/31/2014   CLINICAL DATA:  Followup of small bowel obstruction.  EXAM: ABDOMEN - 1 VIEW  COMPARISON:  CT of 1 day prior  FINDINGS: Supine view of the abdomen and pelvis. Mid small bowel loops measure up to the 3.3 cm, similar to on the prior exam. Gas and contrast identified throughout the colon. Contrast in the urinary bladder as well. No pneumatosis or free intraperitoneal air.  IMPRESSION: Similar partial small bowel obstruction.   Electronically Signed   By: Abigail Miyamoto M.D.   On: 01/31/2014 09:21   Ct Abdomen Pelvis W Contrast  01/30/2014   CLINICAL DATA:  History of colon carcinoma an abdominal pain  EXAM: CT ABDOMEN AND PELVIS WITH CONTRAST  TECHNIQUE: Multidetector CT imaging of the abdomen and pelvis was performed using the standard protocol following bolus administration of intravenous contrast.  CONTRAST:  32mL OMNIPAQUE IOHEXOL 300 MG/ML SOLN, 122mL OMNIPAQUE IOHEXOL 300 MG/ML SOLN  COMPARISON:  None.  FINDINGS: Lung bases are free of acute infiltrate or sizable effusion. Atherosclerotic changes are noted in the descending thoracic aorta as well as within the abdominal aorta.  The liver, gallbladder, spleen, pancreas and adrenal glands are within normal limits. Kidneys are well visualized bilaterally and reveal renal cystic change on the right. No calculi or obstructive changes are seen.  There is significant distention of the stomach, jejunum and proximal ileum. An area of relative caliber change is noted in the proximal to mid ileum best seen on image number 82 of series 2. These changes are  likely related to a adhesions from patient's known prior surgery as no definitive mass lesion is seen. The more distal ileum is fluid filled but otherwise within normal limits. These changes are consistent with a partial small bowel obstruction.  The bladder is well distended. No pelvic mass lesion or sidewall abnormality is seen. A small amount of free pelvic fluid is noted. Postsurgical changes are noted in the rectum. The osseous structures show no acute abnormality. 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  IMPRESSION: Partial small bowel obstruction in the proximal to mid ileum likely related to adhesions.  No other acute abnormality is seen.   Electronically Signed  By: Inez Catalina M.D.   On: 01/30/2014 12:42    Scheduled Meds: . enoxaparin (LOVENOX) injection  40 mg Subcutaneous Q24H  . insulin aspart  0-9 Units Subcutaneous TID WC  . metoprolol  5 mg Intravenous 3 times per day  . pantoprazole (PROTONIX) IV  40 mg Intravenous Q24H   Continuous Infusions: . sodium chloride 75 mL/hr at 01/31/14 1438    Principal Problem:   Small bowel obstruction Active Problems:   Dementia   Nausea vomiting and diarrhea   Thrombocytopenia   Leukocytosis   Hyperglycemia   SBO (small bowel obstruction)   Heme positive stool    Time spent: 30 minutes    Eugenie Filler M.D. [facility] Pager [phone]. If 7PM-7AM, please contact night-coverage at [email or web address], password [facility] 01/31/2014, 4:29 PM  LOS: 1 day

## 2014-01-31 NOTE — Consult Note (Signed)
Reason for Consult: Small bowel obstruction Referring Physician: [facility]  Jose Knox is an 80 y.o. male.  HPI: Patient is an 80 year old white male status post multiple abdominal surgeries in the remote past including a colon resection who presented emergency room with worsening nausea, vomiting, and diarrhea. CT scan the abdomen was done which revealed a small bowel obstruction secondary to adhesive disease. He was admitted to the hospital for further evaluation treatment. They attempted an NG tube in the emergency room but could not place it. Since he has been in the hospital, has had multiple bowel movements overnight. He denies any abdominal pain during my examination. He denies any nausea.  Past Medical History  Diagnosis Date  . Dementia   . Small bowel obstruction 2009  . Colon cancer 1990's  . GERD (gastroesophageal reflux disease)     Past Surgical History  Procedure Laterality Date  . Colon resection      multiple surgeries in [place], [place]    History reviewed. No pertinent family history.  Social History:  reports that he has never smoked. He does not have any smokeless tobacco history on file. He reports that he does not drink alcohol or use illicit drugs.  Allergies: No Known Allergies  Medications: I have reviewed the patient's current medications.  Results for orders placed during the hospital encounter of 01/30/14 (from the past 48 hour(s))  POC OCCULT BLOOD, ED     Status: Abnormal   Collection Time    01/30/14 10:39 AM      Result Value Ref Range   Fecal Occult Bld POSITIVE (*) NEGATIVE  CBC WITH DIFFERENTIAL     Status: Abnormal   Collection Time    01/30/14 11:15 AM      Result Value Ref Range   WBC 13.5 (*) 4.0 - 10.5 K/uL   RBC 5.03  4.22 - 5.81 MIL/uL   Hemoglobin 16.2  13.0 - 17.0 g/dL   HCT 45.8  39.0 - 52.0 %   MCV 91.1  78.0 - 100.0 fL   MCH 32.2  26.0 - 34.0 pg   MCHC 35.4  30.0 - 36.0 g/dL   RDW 12.7  11.5 - 15.5 %   Platelets  130 (*) 150 - 400 K/uL   Neutrophils Relative % 82 (*) 43 - 77 %   Neutro Abs 11.1 (*) 1.7 - 7.7 K/uL   Lymphocytes Relative 9 (*) 12 - 46 %   Lymphs Abs 1.2  0.7 - 4.0 K/uL   Monocytes Relative 9  3 - 12 %   Monocytes Absolute 1.2 (*) 0.1 - 1.0 K/uL   Eosinophils Relative 0  0 - 5 %   Eosinophils Absolute 0.0  0.0 - 0.7 K/uL   Basophils Relative 0  0 - 1 %   Basophils Absolute 0.0  0.0 - 0.1 K/uL  COMPREHENSIVE METABOLIC PANEL     Status: Abnormal   Collection Time    01/30/14 11:15 AM      Result Value Ref Range   Sodium 137  137 - 147 mEq/L   Potassium 4.2  3.7 - 5.3 mEq/L   Chloride 97  96 - 112 mEq/L   CO2 27  19 - 32 mEq/L   Glucose, Bld 139 (*) 70 - 99 mg/dL   BUN 17  6 - 23 mg/dL   Creatinine, Ser 0.90  0.50 - 1.35 mg/dL   Calcium 9.8  8.4 - 10.5 mg/dL   Total Protein 7.3  6.0 -  8.3 g/dL   Albumin 4.1  3.5 - 5.2 g/dL   AST 29  0 - 37 U/L   ALT 22  0 - 53 U/L   Alkaline Phosphatase 67  39 - 117 U/L   Total Bilirubin 1.3 (*) 0.3 - 1.2 mg/dL   GFR calc non Af Amer 77 (*) >90 mL/min   GFR calc Af Amer 89 (*) >90 mL/min   Comment: (NOTE)     The eGFR has been calculated using the CKD EPI equation.     This calculation has not been validated in all clinical situations.     eGFR's persistently <90 mL/min signify possible Chronic Kidney     Disease.  LIPASE, BLOOD     Status: None   Collection Time    01/30/14 11:15 AM      Result Value Ref Range   Lipase 12  11 - 59 U/L  LACTIC ACID, PLASMA     Status: Abnormal   Collection Time    01/30/14 11:15 AM      Result Value Ref Range   Lactic Acid, Venous 2.6 (*) 0.5 - 2.2 mmol/L  TROPONIN I     Status: None   Collection Time    01/30/14 11:15 AM      Result Value Ref Range   Troponin I <0.30  <0.30 ng/mL   Comment:            Due to the release kinetics of cTnI,     a negative result within the first hours     of the onset of symptoms does not rule out     myocardial infarction with certainty.     If myocardial  infarction is still suspected,     repeat the test at appropriate intervals.  SAMPLE TO BLOOD BANK     Status: None   Collection Time    01/30/14 11:15 AM      Result Value Ref Range   Blood Bank Specimen SAMPLE AVAILABLE FOR TESTING     Sample Expiration 02/02/2014    HEMOGLOBIN A1C     Status: None   Collection Time    01/30/14 11:15 AM      Result Value Ref Range   Hemoglobin A1C 5.3  <5.7 %   Comment: (NOTE)                                                                               According to the ADA Clinical Practice Recommendations for 2011, when     HbA1c is used as a screening test:      >=6.5%   Diagnostic of Diabetes Mellitus               (if abnormal result is confirmed)     5.7-6.4%   Increased risk of developing Diabetes Mellitus     References:Diagnosis and Classification of Diabetes Mellitus,Diabetes     WLSL,3734,28(JGOTL 1):S62-S69 and Standards of Medical Care in             Diabetes - 2011,Diabetes Care,2011,34 (Suppl 1):S11-S61.   Mean Plasma Glucose 105  <117 mg/dL   Comment: Performed at [facility]  CEA  Status: None   Collection Time    01/30/14  1:50 PM      Result Value Ref Range   CEA 1.1  0.0 - 5.0 ng/mL   Comment: Performed at [facility]     Status: Abnormal   Collection Time    01/30/14  3:20 PM      Result Value Ref Range   Color, Urine YELLOW  YELLOW   APPearance CLEAR  CLEAR   Specific Gravity, Urine 1.015  1.005 - 1.030   pH 5.5  5.0 - 8.0   Glucose, UA NEGATIVE  NEGATIVE mg/dL   Hgb urine dipstick SMALL (*) NEGATIVE   Bilirubin Urine NEGATIVE  NEGATIVE   Ketones, ur NEGATIVE  NEGATIVE mg/dL   Protein, ur 30 (*) NEGATIVE mg/dL   Urobilinogen, UA 0.2  0.0 - 1.0 mg/dL   Nitrite NEGATIVE  NEGATIVE   Leukocytes, UA NEGATIVE  NEGATIVE  URINE MICROSCOPIC-ADD ON     Status: None   Collection Time    01/30/14  3:20 PM      Result Value Ref Range   Squamous Epithelial /  LPF RARE  RARE   WBC, UA 0-2  <3 WBC/hpf   RBC / HPF 0-2  <3 RBC/hpf   Bacteria, UA RARE  RARE   Urine-Other MUCOUS PRESENT    GLUCOSE, CAPILLARY     Status: Abnormal   Collection Time    01/30/14  5:28 PM      Result Value Ref Range   Glucose-Capillary 109 (*) 70 - 99 mg/dL   Comment 1 Notify RN     Comment 2 Documented in Chart    GLUCOSE, CAPILLARY     Status: Abnormal   Collection Time    01/30/14  8:33 PM      Result Value Ref Range   Glucose-Capillary 141 (*) 70 - 99 mg/dL  CLOSTRIDIUM DIFFICILE BY PCR     Status: None   Collection Time    01/31/14  2:08 AM      Result Value Ref Range   C difficile by pcr NEGATIVE  NEGATIVE  BASIC METABOLIC PANEL     Status: Abnormal   Collection Time    01/31/14  6:21 AM      Result Value Ref Range   Sodium 136 (*) 137 - 147 mEq/L   Potassium 3.8  3.7 - 5.3 mEq/L   Chloride 99  96 - 112 mEq/L   CO2 22  19 - 32 mEq/L   Glucose, Bld 101 (*) 70 - 99 mg/dL   BUN 19  6 - 23 mg/dL   Creatinine, Ser 0.96  0.50 - 1.35 mg/dL   Calcium 8.1 (*) 8.4 - 10.5 mg/dL   GFR calc non Af Amer 75 (*) >90 mL/min   GFR calc Af Amer 86 (*) >90 mL/min   Comment: (NOTE)     The eGFR has been calculated using the CKD EPI equation.     This calculation has not been validated in all clinical situations.     eGFR's persistently <90 mL/min signify possible Chronic Kidney     Disease.  MAGNESIUM     Status: None   Collection Time    01/31/14  6:21 AM      Result Value Ref Range   Magnesium 1.6  1.5 - 2.5 mg/dL  CBC     Status: Abnormal   Collection Time    01/31/14  6:21 AM  Result Value Ref Range   WBC 9.2  4.0 - 10.5 K/uL   RBC 4.36  4.22 - 5.81 MIL/uL   Hemoglobin 13.8  13.0 - 17.0 g/dL   HCT 39.9  39.0 - 52.0 %   MCV 91.5  78.0 - 100.0 fL   MCH 31.7  26.0 - 34.0 pg   MCHC 34.6  30.0 - 36.0 g/dL   RDW 12.7  11.5 - 15.5 %   Platelets 127 (*) 150 - 400 K/uL  LACTIC ACID, PLASMA     Status: Abnormal   Collection Time    01/31/14  6:21 AM       Result Value Ref Range   Lactic Acid, Venous 3.6 (*) 0.5 - 2.2 mmol/L  CULTURE, BLOOD (ROUTINE X 2)     Status: None   Collection Time    01/31/14  6:21 AM      Result Value Ref Range   Specimen Description BLOOD RIGHT ARM     Special Requests BAA 8 CC EACH     Culture PENDING     Report Status PENDING    CULTURE, BLOOD (ROUTINE X 2)     Status: None   Collection Time    01/31/14  6:21 AM      Result Value Ref Range   Specimen Description BLOOD LEFT HAND     Special Requests  BAA 6 CC EACH     Culture PENDING     Report Status PENDING    GLUCOSE, CAPILLARY     Status: Abnormal   Collection Time    01/31/14  7:39 AM      Result Value Ref Range   Glucose-Capillary 114 (*) 70 - 99 mg/dL   Comment 1 Documented in Chart     Comment 2 Notify RN      Dg Chest 2 View  01/30/2014   CLINICAL DATA:  Nausea and vomiting  EXAM: CHEST  2 VIEW  COMPARISON:  None.  FINDINGS: The cardiac shadow is within normal limits. The lungs are clear bilaterally. An air-fluid level is noted within the gastric lumen. No gross bony abnormality is noted.  IMPRESSION: No active cardiopulmonary disease.   Electronically Signed   By: Inez Catalina M.D.   On: 01/30/2014 11:57   Dg Abd 1 View  01/31/2014   CLINICAL DATA:  Followup of small bowel obstruction.  EXAM: ABDOMEN - 1 VIEW  COMPARISON:  CT of 1 day prior  FINDINGS: Supine view of the abdomen and pelvis. Mid small bowel loops measure up to the 3.3 cm, similar to on the prior exam. Gas and contrast identified throughout the colon. Contrast in the urinary bladder as well. No pneumatosis or free intraperitoneal air.  IMPRESSION: Similar partial small bowel obstruction.   Electronically Signed   By: Abigail Miyamoto M.D.   On: 01/31/2014 09:21   Ct Abdomen Pelvis W Contrast  01/30/2014   CLINICAL DATA:  History of colon carcinoma an abdominal pain  EXAM: CT ABDOMEN AND PELVIS WITH CONTRAST  TECHNIQUE: Multidetector CT imaging of the abdomen and pelvis was performed using  the standard protocol following bolus administration of intravenous contrast.  CONTRAST:  49m OMNIPAQUE IOHEXOL 300 MG/ML SOLN, 1045mOMNIPAQUE IOHEXOL 300 MG/ML SOLN  COMPARISON:  None.  FINDINGS: Lung bases are free of acute infiltrate or sizable effusion. Atherosclerotic changes are noted in the descending thoracic aorta as well as within the abdominal aorta.  The liver, gallbladder, spleen, pancreas and adrenal glands are within normal  limits. Kidneys are well visualized bilaterally and reveal renal cystic change on the right. No calculi or obstructive changes are seen.  There is significant distention of the stomach, jejunum and proximal ileum. An area of relative caliber change is noted in the proximal to mid ileum best seen on image number 82 of series 2. These changes are likely related to a adhesions from patient's known prior surgery as no definitive mass lesion is seen. The more distal ileum is fluid filled but otherwise within normal limits. These changes are consistent with a partial small bowel obstruction.  The bladder is well distended. No pelvic mass lesion or sidewall abnormality is seen. A small amount of free pelvic fluid is noted. Postsurgical changes are noted in the rectum. The osseous structures show no acute abnormality. 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  IMPRESSION: Partial small bowel obstruction in the proximal to mid ileum likely related to adhesions.  No other acute abnormality is seen.   Electronically Signed   By: Inez Catalina M.D.   On: 01/30/2014 12:42    ROS: See chart Blood pressure 119/46, pulse 71, temperature 98.7 F (37.1 C), temperature source Tympanic, resp. rate 18, height _0  (1.778 m), weight 68.5 kg (151 lb 0.2 oz), SpO2 92.00%. Physical Exam: Pleasant white male no acute distress. Abdomen is soft and flat. No hernias are noted. Occasional bowel sounds are appreciated. Rectal examination was deferred at this  time.  Assessment/Plan: Impression: Small bowel obstruction, resolving Plan: We'll start clear liquid diet and advance as tolerated. No need for acute surgical intervention at this time. Will follow with you.  Jamesetta So 01/31/2014, 11:17 AM

## 2014-01-31 NOTE — Progress Notes (Signed)
Patient had large watery BM. No more complaints of nausea.

## 2014-02-01 LAB — GLUCOSE, CAPILLARY
GLUCOSE-CAPILLARY: 95 mg/dL (ref 70–99)
GLUCOSE-CAPILLARY: 108 mg/dL — AB (ref 70–99)
Glucose-Capillary: 95 mg/dL (ref 70–99)

## 2014-02-01 LAB — BASIC METABOLIC PANEL
BUN: 16 mg/dL (ref 6–23)
CHLORIDE: 105 meq/L (ref 96–112)
CO2: 24 meq/L (ref 19–32)
CREATININE: 0.98 mg/dL (ref 0.50–1.35)
Calcium: 7.7 mg/dL — ABNORMAL LOW (ref 8.4–10.5)
GFR calc Af Amer: 86 mL/min — ABNORMAL LOW (ref >90)
GFR calc non Af Amer: 74 mL/min — ABNORMAL LOW (ref >90)
Glucose, Bld: 96 mg/dL (ref 70–99)
Potassium: 3.7 mEq/L (ref 3.7–5.3)
Sodium: 138 mEq/L (ref 137–147)

## 2014-02-01 LAB — CBC
HCT: 36.4 % — ABNORMAL LOW (ref 39.0–52.0)
Hemoglobin: 12.5 g/dL — ABNORMAL LOW (ref 13.0–17.0)
MCH: 31.8 pg (ref 26.0–34.0)
MCHC: 34.3 g/dL (ref 30.0–36.0)
MCV: 92.6 fL (ref 78.0–100.0)
Platelets: 98 10*3/uL — ABNORMAL LOW (ref 150–400)
RBC: 3.93 MIL/uL — ABNORMAL LOW (ref 4.22–5.81)
RDW: 12.8 % (ref 11.5–15.5)
WBC: 7.1 10*3/uL (ref 4.0–10.5)

## 2014-02-01 LAB — MAGNESIUM: Magnesium: 2.5 mg/dL (ref 1.5–2.5)

## 2014-02-01 MED ORDER — PANTOPRAZOLE SODIUM 40 MG PO TBEC
40.0000 mg | DELAYED_RELEASE_TABLET | Freq: Every day | ORAL | Status: DC
  Administered 2014-02-01 – 2014-02-03 (×3): 40 mg via ORAL
  Filled 2014-02-01 (×3): qty 1

## 2014-02-01 NOTE — Progress Notes (Signed)
Subjective: Continues to have multiple bowel movements. They are watery in nature. C. difficile negative.  Objective: Vital signs in last 24 hours: Temp:  [98.4 F (36.9 C)-99.5 F (37.5 C)] 98.4 F (36.9 C) (06/07 0521) Pulse Rate:  [65-66] 65 (06/07 0521) Resp:  [16] 16 (06/07 0521) BP: (123-145)/(57-98) 123/98 mmHg (06/07 0521) SpO2:  [95 %-99 %] 95 % (06/07 0521) Last BM Date: 02/01/14  Intake/Output from previous day: 06/06 0701 - 06/07 0700 In: 3642.5 [P.O.:720; I.V.:2872.5; IV Piggyback:50] Out: 406 [Urine:403; Stool:3] Intake/Output this shift:    General appearance: alert, cooperative and no distress GI: soft, non-tender; bowel sounds normal; no masses,  no organomegaly  Lab Results:   Recent Labs  01/31/14 0621 02/01/14 0534  WBC 9.2 7.1  HGB 13.8 12.5*  HCT 39.9 36.4*  PLT 127* 98*   BMET  Recent Labs  01/31/14 0621 02/01/14 0534  NA 136* 138  K 3.8 3.7  CL 99 105  CO2 22 24  GLUCOSE 101* 96  BUN 19 16  CREATININE 0.96 0.98  CALCIUM 8.1* 7.7*   PT/INR No results found for this basename: LABPROT, INR,  in the last 72 hours  Studies/Results: Dg Chest 2 View  01/30/2014   CLINICAL DATA:  Nausea and vomiting  EXAM: CHEST  2 VIEW  COMPARISON:  None.  FINDINGS: The cardiac shadow is within normal limits. The lungs are clear bilaterally. An air-fluid level is noted within the gastric lumen. No gross bony abnormality is noted.  IMPRESSION: No active cardiopulmonary disease.   Electronically Signed   By: Inez Catalina M.D.   On: 01/30/2014 11:57   Dg Abd 1 View  01/31/2014   CLINICAL DATA:  Followup of small bowel obstruction.  EXAM: ABDOMEN - 1 VIEW  COMPARISON:  CT of 1 day prior  FINDINGS: Supine view of the abdomen and pelvis. Mid small bowel loops measure up to the 3.3 cm, similar to on the prior exam. Gas and contrast identified throughout the colon. Contrast in the urinary bladder as well. No pneumatosis or free intraperitoneal air.  IMPRESSION:  Similar partial small bowel obstruction.   Electronically Signed   By: Abigail Miyamoto M.D.   On: 01/31/2014 09:21   Ct Abdomen Pelvis W Contrast  01/30/2014   CLINICAL DATA:  History of colon carcinoma an abdominal pain  EXAM: CT ABDOMEN AND PELVIS WITH CONTRAST  TECHNIQUE: Multidetector CT imaging of the abdomen and pelvis was performed using the standard protocol following bolus administration of intravenous contrast.  CONTRAST:  39mL OMNIPAQUE IOHEXOL 300 MG/ML SOLN, 131mL OMNIPAQUE IOHEXOL 300 MG/ML SOLN  COMPARISON:  None.  FINDINGS: Lung bases are free of acute infiltrate or sizable effusion. Atherosclerotic changes are noted in the descending thoracic aorta as well as within the abdominal aorta.  The liver, gallbladder, spleen, pancreas and adrenal glands are within normal limits. Kidneys are well visualized bilaterally and reveal renal cystic change on the right. No calculi or obstructive changes are seen.  There is significant distention of the stomach, jejunum and proximal ileum. An area of relative caliber change is noted in the proximal to mid ileum best seen on image number 82 of series 2. These changes are likely related to a adhesions from patient's known prior surgery as no definitive mass lesion is seen. The more distal ileum is fluid filled but otherwise within normal limits. These changes are consistent with a partial small bowel obstruction.  The bladder is well distended. No pelvic mass lesion or sidewall  abnormality is seen. A small amount of free pelvic fluid is noted. Postsurgical changes are noted in the rectum. The osseous structures show no acute abnormality. 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  IMPRESSION: Partial small bowel obstruction in the proximal to mid ileum likely related to adhesions.  No other acute abnormality is seen.   Electronically Signed   By: Inez Catalina M.D.   On: 01/30/2014 12:42    Anti-infectives: Anti-infectives   None       Assessment/Plan: Impression: Small bowel obstruction, resolving Plan: Will advance diet as tolerated. Would not use Imodium at this time.  LOS: 2 days    Jose Knox 02/01/2014

## 2014-02-01 NOTE — Progress Notes (Signed)
[facility] PROGRESS NOTE  ELIYAHU BILLE GHW:[id number] DOB: [birth year] DOA: 01/30/2014 PCP: Su Monks, MD  Assessment/Plan: #1 small bowel obstruction Patient with no nausea no vomiting no abdominal pain. Patient denies any flatus or bowel movement however endorses flatus and bowel movement to the general surgeon. KUB with no significant change yesterday. Patient tolerating clears. Diet advanced to full liquids per general surgery. IV fluids. Conservative treatment. Mobilize. Follow.  #2 nausea vomiting and diarrhea Likely secondary to problem #1. No further nausea or vomiting. Stool is negative for C. difficile PCR. IV fluids. Supportive care.  #3 leukocytosis Likely reactive leukocytosis secondary to problem #1. Leukocytosis has improved. Chest x-ray was negative for any acute infiltrate. Urinalysis is negative. No need for antibiotics at this time. Follow.  #4 thrombocytopenia Stable.  #5 dementia Stable.  #6 prophylaxis Protonix for GI prophylaxis. Lovenox for DVT prophylaxis.  Code Status: Full Family Communication: Updated patient and wife at bedside. Disposition Plan: Home when medically stable.   Consultants:  General surgery: Dr. Arnoldo Morale 01/31/2014  Procedures:  CT abdomen and pelvis 01/30/2014  KUB 01/31/2014  Chest x-ray 01/30/2014  Antibiotics:  None  HPI/Subjective: Patient denies any abdominal pain. No nausea. No vomiting. Patient with multiple loose watery stools. Tolerating clears.  Objective: Filed Vitals:   02/01/14 0521  BP: 123/98  Pulse: 65  Temp: 98.4 F (36.9 C)  Resp: 16    Intake/Output Summary (Last 24 hours) at 02/01/14 1051 Last data filed at 02/01/14 0558  Gross per 24 hour  Intake 2342.5 ml  Output    404 ml  Net 1938.5 ml   Filed Weights   01/30/14 0955 01/30/14 1515  Weight: 70.308 kg (155 lb) 68.5 kg (151 lb 0.2 oz)    Exam:   General:  NAD  Cardiovascular: RRR  Respiratory: CTAB  Abdomen:  Soft, nontender, nondistended, positive bowel sounds.  Musculoskeletal: No clubbing cyanosis or edema  Data Reviewed: Basic Metabolic Panel:  Recent Labs Lab 01/30/14 1115 01/31/14 0621 02/01/14 0534  NA 137 136* 138  K 4.2 3.8 3.7  CL 97 99 105  CO2 27 22 24   GLUCOSE 139* 101* 96  BUN 17 19 16   CREATININE 0.90 0.96 0.98  CALCIUM 9.8 8.1* 7.7*  MG  --  1.6 2.5   Liver Function Tests:  Recent Labs Lab 01/30/14 1115  AST 29  ALT 22  ALKPHOS 67  BILITOT 1.3*  PROT 7.3  ALBUMIN 4.1    Recent Labs Lab 01/30/14 1115  LIPASE 12   No results found for this basename: AMMONIA,  in the last 168 hours CBC:  Recent Labs Lab 01/30/14 1115 01/31/14 0621 02/01/14 0534  WBC 13.5* 9.2 7.1  NEUTROABS 11.1*  --   --   HGB 16.2 13.8 12.5*  HCT 45.8 39.9 36.4*  MCV 91.1 91.5 92.6  PLT 130* 127* 98*   Cardiac Enzymes:  Recent Labs Lab 01/30/14 1115  TROPONINI <0.30   BNP (last 3 results) No results found for this basename: PROBNP,  in the last 8760 hours CBG:  Recent Labs Lab 01/31/14 0739 01/31/14 1135 01/31/14 1700 01/31/14 2141 02/01/14 0723  GLUCAP 114* 95 79 83 95    Recent Results (from the past 240 hour(s))  URINE CULTURE     Status: None   Collection Time    01/30/14  3:20 PM      Result Value Ref Range Status   Specimen Description URINE, CLEAN CATCH   Final   Special Requests NONE  Final   Culture  Setup Time     Final   Value: 01/30/2014 23:36     Performed at [facility]     Final   Value: 35,000 COLONIES/ML     Performed at [facility]   Culture     Final   Value: Multiple bacterial morphotypes present, none predominant. Suggest appropriate recollection if clinically indicated.     Performed at [facility]   Report Status 01/31/2014 FINAL   Final  CLOSTRIDIUM DIFFICILE BY PCR     Status: None   Collection Time    01/31/14  2:08 AM      Result Value Ref Range Status   C difficile by pcr  NEGATIVE  NEGATIVE Final  CULTURE, BLOOD (ROUTINE X 2)     Status: None   Collection Time    01/31/14  6:21 AM      Result Value Ref Range Status   Specimen Description BLOOD RIGHT ARM   Final   Special Requests BAA 8 CC EACH   Final   Culture NO GROWTH 1 DAY   Final   Report Status PENDING   Incomplete  CULTURE, BLOOD (ROUTINE X 2)     Status: None   Collection Time    01/31/14  6:21 AM      Result Value Ref Range Status   Specimen Description BLOOD LEFT HAND   Final   Special Requests  BAA 6 CC EACH   Final   Culture NO GROWTH 1 DAY   Final   Report Status PENDING   Incomplete     Studies: Dg Chest 2 View  01/30/2014   CLINICAL DATA:  Nausea and vomiting  EXAM: CHEST  2 VIEW  COMPARISON:  None.  FINDINGS: The cardiac shadow is within normal limits. The lungs are clear bilaterally. An air-fluid level is noted within the gastric lumen. No gross bony abnormality is noted.  IMPRESSION: No active cardiopulmonary disease.   Electronically Signed   By: Inez Catalina M.D.   On: 01/30/2014 11:57   Dg Abd 1 View  01/31/2014   CLINICAL DATA:  Followup of small bowel obstruction.  EXAM: ABDOMEN - 1 VIEW  COMPARISON:  CT of 1 day prior  FINDINGS: Supine view of the abdomen and pelvis. Mid small bowel loops measure up to the 3.3 cm, similar to on the prior exam. Gas and contrast identified throughout the colon. Contrast in the urinary bladder as well. No pneumatosis or free intraperitoneal air.  IMPRESSION: Similar partial small bowel obstruction.   Electronically Signed   By: Abigail Miyamoto M.D.   On: 01/31/2014 09:21   Ct Abdomen Pelvis W Contrast  01/30/2014   CLINICAL DATA:  History of colon carcinoma an abdominal pain  EXAM: CT ABDOMEN AND PELVIS WITH CONTRAST  TECHNIQUE: Multidetector CT imaging of the abdomen and pelvis was performed using the standard protocol following bolus administration of intravenous contrast.  CONTRAST:  37mL OMNIPAQUE IOHEXOL 300 MG/ML SOLN, 132mL OMNIPAQUE IOHEXOL 300 MG/ML  SOLN  COMPARISON:  None.  FINDINGS: Lung bases are free of acute infiltrate or sizable effusion. Atherosclerotic changes are noted in the descending thoracic aorta as well as within the abdominal aorta.  The liver, gallbladder, spleen, pancreas and adrenal glands are within normal limits. Kidneys are well visualized bilaterally and reveal renal cystic change on the right. No calculi or obstructive changes are seen.  There is significant distention of the stomach, jejunum and  proximal ileum. An area of relative caliber change is noted in the proximal to mid ileum best seen on image number 82 of series 2. These changes are likely related to a adhesions from patient's known prior surgery as no definitive mass lesion is seen. The more distal ileum is fluid filled but otherwise within normal limits. These changes are consistent with a partial small bowel obstruction.  The bladder is well distended. No pelvic mass lesion or sidewall abnormality is seen. A small amount of free pelvic fluid is noted. Postsurgical changes are noted in the rectum. The osseous structures show no acute abnormality. 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  IMPRESSION: Partial small bowel obstruction in the proximal to mid ileum likely related to adhesions.  No other acute abnormality is seen.   Electronically Signed   By: Inez Catalina M.D.   On: 01/30/2014 12:42    Scheduled Meds: . enoxaparin (LOVENOX) injection  40 mg Subcutaneous Q24H  . insulin aspart  0-9 Units Subcutaneous TID WC  . metoprolol  5 mg Intravenous 3 times per day  . pantoprazole (PROTONIX) IV  40 mg Intravenous Q24H   Continuous Infusions: . sodium chloride 75 mL/hr at 01/31/14 1438    Principal Problem:   Small bowel obstruction Active Problems:   Dementia   Nausea vomiting and diarrhea   Thrombocytopenia   Leukocytosis   Hyperglycemia   SBO (small bowel obstruction)   Heme positive stool    Time spent: 30  minutes    Eugenie Filler M.D. [facility] Pager [phone]. If 7PM-7AM, please contact night-coverage at [email or web address], password [facility] 02/01/2014, 10:51 AM  LOS: 2 days

## 2014-02-01 NOTE — Progress Notes (Signed)
Utilization review Completed Gwendy Boeder RN BSN   

## 2014-02-01 NOTE — Progress Notes (Signed)
Notified by NT, patients temp is 102.9. Blood cultures drawn on 01/31/14. Will page on-call physician and follow any orders given.

## 2014-02-01 NOTE — Progress Notes (Signed)
Attempted times 2 to get pt OOB and ambulate in halls, patient stated he did not feel like it and would walk later.  Walker at the bedside.

## 2014-02-01 NOTE — Progress Notes (Signed)
The patient is receiving Protonix by the intravenous route.  Based on criteria approved by the Pharmacy and [place], the medication is being converted to the equivalent oral dose form.  These criteria include: -No Active GI bleeding -Able to tolerate diet of full liquids (or better) or tube feeding OR able to tolerate other medications by the oral or enteral route  If you have any questions about this conversion, please contact the Pharmacy Department (ext [phone]).  Thank you.  Pricilla Larsson, [facility] 02/01/2014 12:37 PM

## 2014-02-02 ENCOUNTER — Inpatient Hospital Stay (HOSPITAL_COMMUNITY): Payer: Medicare Other

## 2014-02-02 DIAGNOSIS — N390 Urinary tract infection, site not specified: Secondary | ICD-10-CM

## 2014-02-02 DIAGNOSIS — R509 Fever, unspecified: Secondary | ICD-10-CM | POA: Diagnosis not present

## 2014-02-02 DIAGNOSIS — I10 Essential (primary) hypertension: Secondary | ICD-10-CM | POA: Diagnosis present

## 2014-02-02 LAB — URINALYSIS, ROUTINE W REFLEX MICROSCOPIC
Bilirubin Urine: NEGATIVE
Glucose, UA: NEGATIVE mg/dL
Hgb urine dipstick: NEGATIVE
Ketones, ur: NEGATIVE mg/dL
Nitrite: POSITIVE — AB
PROTEIN: NEGATIVE mg/dL
Specific Gravity, Urine: 1.015 (ref 1.005–1.030)
Urobilinogen, UA: 4 mg/dL — ABNORMAL HIGH (ref 0.0–1.0)
pH: 6.5 (ref 5.0–8.0)

## 2014-02-02 LAB — CBC
HEMATOCRIT: 40.8 % (ref 39.0–52.0)
HEMOGLOBIN: 14.2 g/dL (ref 13.0–17.0)
MCH: 31.9 pg (ref 26.0–34.0)
MCHC: 34.8 g/dL (ref 30.0–36.0)
MCV: 91.7 fL (ref 78.0–100.0)
Platelets: 112 10*3/uL — ABNORMAL LOW (ref 150–400)
RBC: 4.45 MIL/uL (ref 4.22–5.81)
RDW: 12.6 % (ref 11.5–15.5)
WBC: 10 10*3/uL (ref 4.0–10.5)

## 2014-02-02 LAB — BASIC METABOLIC PANEL
BUN: 12 mg/dL (ref 6–23)
CHLORIDE: 100 meq/L (ref 96–112)
CO2: 25 mEq/L (ref 19–32)
Calcium: 8.3 mg/dL — ABNORMAL LOW (ref 8.4–10.5)
Creatinine, Ser: 0.97 mg/dL (ref 0.50–1.35)
GFR calc non Af Amer: 74 mL/min — ABNORMAL LOW (ref >90)
GFR, EST AFRICAN AMERICAN: 86 mL/min — AB (ref >90)
Glucose, Bld: 115 mg/dL — ABNORMAL HIGH (ref 70–99)
POTASSIUM: 4.1 meq/L (ref 3.7–5.3)
SODIUM: 137 meq/L (ref 137–147)

## 2014-02-02 LAB — URINE MICROSCOPIC-ADD ON

## 2014-02-02 LAB — GLUCOSE, CAPILLARY
GLUCOSE-CAPILLARY: 108 mg/dL — AB (ref 70–99)
GLUCOSE-CAPILLARY: 139 mg/dL — AB (ref 70–99)
GLUCOSE-CAPILLARY: 128 mg/dL — AB (ref 70–99)
Glucose-Capillary: 119 mg/dL — ABNORMAL HIGH (ref 70–99)

## 2014-02-02 LAB — LACTIC ACID, PLASMA: Lactic Acid, Venous: 1.6 mmol/L (ref 0.5–2.2)

## 2014-02-02 MED ORDER — AMLODIPINE BESYLATE 5 MG PO TABS
5.0000 mg | ORAL_TABLET | Freq: Every day | ORAL | Status: DC
  Administered 2014-02-02: 5 mg via ORAL
  Filled 2014-02-02: qty 1

## 2014-02-02 MED ORDER — METOPROLOL TARTRATE 25 MG PO TABS
12.5000 mg | ORAL_TABLET | Freq: Two times a day (BID) | ORAL | Status: DC
  Administered 2014-02-02 – 2014-02-03 (×3): 12.5 mg via ORAL
  Filled 2014-02-02 (×3): qty 1

## 2014-02-02 MED ORDER — TAMSULOSIN HCL 0.4 MG PO CAPS
0.4000 mg | ORAL_CAPSULE | Freq: Every day | ORAL | Status: DC
  Administered 2014-02-02: 0.4 mg via ORAL
  Filled 2014-02-02: qty 1

## 2014-02-02 MED ORDER — DEXTROSE 5 % IV SOLN
1.0000 g | INTRAVENOUS | Status: DC
  Administered 2014-02-02: 1 g via INTRAVENOUS
  Filled 2014-02-02 (×3): qty 10

## 2014-02-02 NOTE — Progress Notes (Signed)
Bladder scan performed with 70 ml's in bladder.Prior to patient being scaned he had voided 200 ml's.Family at bedside,will continue to monitor patient.

## 2014-02-02 NOTE — Progress Notes (Signed)
Dr Grandville Silos notified of patient's temp 102 oral,will continue to monitor patient.

## 2014-02-02 NOTE — Progress Notes (Signed)
ANTIBIOTIC CONSULT NOTE - INITIAL  Pharmacy Consult for Rocephin Indication: UTI  No Known Allergies  Patient Measurements: Height: 5\' 10"  (177.8 cm) Weight: 151 lb 0.2 oz (68.5 kg) IBW/kg (Calculated) : 73  Vital Signs: Temp: 100.4 F (38 C) (06/08 1005) Temp src: Oral (06/08 1005) BP: 162/56 mmHg (06/08 1005) Pulse Rate: 73 (06/08 1005) Intake/Output from previous day: 06/07 0701 - 06/08 0700 In: 2296.7 [P.O.:960; I.V.:1336.7] Out: 1225 [Urine:1225] Intake/Output from this shift: Total I/O In: 240 [P.O.:240] Out: 200 [Urine:200]  Labs:  Recent Labs  01/31/14 0621 02/01/14 0534 02/02/14 0717  WBC 9.2 7.1 10.0  HGB 13.8 12.5* 14.2  PLT 127* 98* 112*  CREATININE 0.96 0.98 0.97   Estimated Creatinine Clearance: 55.9 ml/min (by C-G formula based on Cr of 0.97). No results found for this basename: VANCOTROUGH, Corlis Leak, VANCORANDOM, GENTTROUGH, GENTPEAK, GENTRANDOM, TOBRATROUGH, TOBRAPEAK, TOBRARND, AMIKACINPEAK, AMIKACINTROU, AMIKACIN,  in the last 72 hours   Microbiology: Recent Results (from the past 720 hour(s))  URINE CULTURE     Status: None   Collection Time    01/30/14  3:20 PM      Result Value Ref Range Status   Specimen Description URINE, CLEAN CATCH   Final   Special Requests NONE   Final   Culture  Setup Time     Final   Value: 01/30/2014 23:36     Performed at [place]     Final   Value: 35,000 COLONIES/ML     Performed at [facility]   Culture     Final   Value: Multiple bacterial morphotypes present, none predominant. Suggest appropriate recollection if clinically indicated.     Performed at [facility]   Report Status 01/31/2014 FINAL   Final  CLOSTRIDIUM DIFFICILE BY PCR     Status: None   Collection Time    01/31/14  2:08 AM      Result Value Ref Range Status   C difficile by pcr NEGATIVE  NEGATIVE Final  CULTURE, BLOOD (ROUTINE X 2)     Status: None   Collection Time    01/31/14  6:21 AM       Result Value Ref Range Status   Specimen Description BLOOD RIGHT ARM   Final   Special Requests BAA 8 CC EACH   Final   Culture NO GROWTH 1 DAY   Final   Report Status PENDING   Incomplete  CULTURE, BLOOD (ROUTINE X 2)     Status: None   Collection Time    01/31/14  6:21 AM      Result Value Ref Range Status   Specimen Description BLOOD LEFT HAND   Final   Special Requests  BAA 6 CC EACH   Final   Culture NO GROWTH 1 DAY   Final   Report Status PENDING   Incomplete  CULTURE, BLOOD (ROUTINE X 2)     Status: None   Collection Time    02/01/14 10:21 PM      Result Value Ref Range Status   Specimen Description Blood LEFT ANTECUBITAL   Final   Special Requests BOTTLES DRAWN AEROBIC AND ANAEROBIC [place]   Final   Culture PENDING   Incomplete   Report Status PENDING   Incomplete  CULTURE, BLOOD (ROUTINE X 2)     Status: None   Collection Time    02/01/14 10:28 PM      Result Value Ref Range Status   Specimen Description  Blood BLOOD LEFT HAND   Final   Special Requests BOTTLES DRAWN AEROBIC AND ANAEROBIC 5CC   Final   Culture PENDING   Incomplete   Report Status PENDING   Incomplete   Medical History: Past Medical History  Diagnosis Date  . Dementia   . Small bowel obstruction 2009  . Colon cancer 1990's  . GERD (gastroesophageal reflux disease)    Assessment: 80yo male admitted with suspected UTI.  Asked to initiate Rocephin.  Estimated Creatinine Clearance: 55.9 ml/min (by C-G formula based on Cr of 0.97).  Goal of Therapy:  Eradicate infection.  Plan:   Rocephin 1gm IV q24 hrs  Monitor labs, progress, and cultures  [employer] 02/02/2014,11:09 AM

## 2014-02-02 NOTE — Progress Notes (Signed)
[facility] PROGRESS NOTE  Jose Knox EPP:[id number] DOB: 23-Dec-1927 DOA: 01/30/2014 PCP: Su Monks, MD  Assessment/Plan: #1 small bowel obstruction  Patient with no nausea no vomiting no abdominal pain. Patient denies any flatus or bowel movement however endorses flatus and bowel movement to the general surgeon. KUB with no significant change today. Patient tolerating full liquids await general surgery recommendations for follow up. Continue conservative treatment. Mobilize. Follow.  #2 nausea vomiting and diarrhea  Likely secondary to problem #1. No further nausea or vomiting. Stool is negative for C. difficile PCR. IV fluids. Supportive care.  #3 leukocytosis  Likely reactive leukocytosis secondary to problem #1. Leukocytosis resolved. Follow.  #4 thrombocytopenia  Stable.  #5 dementia  Stable.  #6 prophylaxis  Protonix for GI prophylaxis. Lovenox for DVT prophylaxis.  #6 UTI Urinalysis +nitirite, small leuk, many bacteria and TNTC WBC. Await urine culture in meantime start rocephin #7 fever Likely related to UTI. Xray of abdomen and chest without active disease/infiltrate. Lactic acid within limits of normal. Await blood cultures. Rocephin day #1. Monitor #8 HTN No hx of same. Not on antihypertensive medication at home. Has been requiring metoprolol IV TID during this hospitalization with fair control. Will transition to po and monitor.    Code Status: full Family Communication: wife at bedside Disposition Plan: home when medially stable   Consultants:  General surgery Dr. Arnoldo Morale  Procedures:  none  Antibiotics:  Rocephin 02/02/14>>  HPI/Subjective: Sitting up in bed smiling. Denies pain/discomfort.  Objective: Filed Vitals:   02/02/14 1005  BP: 162/56  Pulse: 73  Temp: 100.4 F (38 C)  Resp:     Intake/Output Summary (Last 24 hours) at 02/02/14 1029 Last data filed at 02/02/14 1005  Gross per 24 hour  Intake 2296.67 ml  Output   1175 ml   Net 1121.67 ml   Filed Weights   01/30/14 0955 01/30/14 1515  Weight: 70.308 kg (155 lb) 68.5 kg (151 lb 0.2 oz)    Exam:   General:  Thin somewhat frail appearing NAD  Cardiovascular: RRR No MGR No LE edema  Respiratory: normal effort BS clear bilaterally no wheeze  Abdomen: flat, soft +BS throughout, non-tender to palpation  Musculoskeletal: joints without swelling/ erythema.    Data Reviewed: Basic Metabolic Panel:  Recent Labs Lab 01/30/14 1115 01/31/14 0621 02/01/14 0534 02/02/14 0717  NA 137 136* 138 137  K 4.2 3.8 3.7 4.1  CL 97 99 105 100  CO2 27 22 24 25   GLUCOSE 139* 101* 96 115*  BUN 17 19 16 12   CREATININE 0.90 0.96 0.98 0.97  CALCIUM 9.8 8.1* 7.7* 8.3*  MG  --  1.6 2.5  --    Liver Function Tests:  Recent Labs Lab 01/30/14 1115  AST 29  ALT 22  ALKPHOS 67  BILITOT 1.3*  PROT 7.3  ALBUMIN 4.1    Recent Labs Lab 01/30/14 1115  LIPASE 12   No results found for this basename: AMMONIA,  in the last 168 hours CBC:  Recent Labs Lab 01/30/14 1115 01/31/14 0621 02/01/14 0534 02/02/14 0717  WBC 13.5* 9.2 7.1 10.0  NEUTROABS 11.1*  --   --   --   HGB 16.2 13.8 12.5* 14.2  HCT 45.8 39.9 36.4* 40.8  MCV 91.1 91.5 92.6 91.7  PLT 130* 127* 98* 112*   Cardiac Enzymes:  Recent Labs Lab 01/30/14 1115  TROPONINI <0.30   BNP (last 3 results) No results found for this basename: PROBNP,  in the last 8760  hours CBG:  Recent Labs Lab 01/31/14 2141 02/01/14 0723 02/01/14 1152 02/01/14 1626 02/02/14 0743  GLUCAP 83 95 108* 95 108*    Recent Results (from the past 240 hour(s))  URINE CULTURE     Status: None   Collection Time    01/30/14  3:20 PM      Result Value Ref Range Status   Specimen Description URINE, CLEAN CATCH   Final   Special Requests NONE   Final   Culture  Setup Time     Final   Value: 01/30/2014 23:36     Performed at [place]     Final   Value: 35,000 COLONIES/ML     Performed at  [facility]   Culture     Final   Value: Multiple bacterial morphotypes present, none predominant. Suggest appropriate recollection if clinically indicated.     Performed at [facility]   Report Status 01/31/2014 FINAL   Final  CLOSTRIDIUM DIFFICILE BY PCR     Status: None   Collection Time    01/31/14  2:08 AM      Result Value Ref Range Status   C difficile by pcr NEGATIVE  NEGATIVE Final  CULTURE, BLOOD (ROUTINE X 2)     Status: None   Collection Time    01/31/14  6:21 AM      Result Value Ref Range Status   Specimen Description BLOOD RIGHT ARM   Final   Special Requests BAA 8 CC EACH   Final   Culture NO GROWTH 1 DAY   Final   Report Status PENDING   Incomplete  CULTURE, BLOOD (ROUTINE X 2)     Status: None   Collection Time    01/31/14  6:21 AM      Result Value Ref Range Status   Specimen Description BLOOD LEFT HAND   Final   Special Requests  BAA 6 CC EACH   Final   Culture NO GROWTH 1 DAY   Final   Report Status PENDING   Incomplete  CULTURE, BLOOD (ROUTINE X 2)     Status: None   Collection Time    02/01/14 10:21 PM      Result Value Ref Range Status   Specimen Description Blood LEFT ANTECUBITAL   Final   Special Requests BOTTLES DRAWN AEROBIC AND ANAEROBIC [place]   Final   Culture PENDING   Incomplete   Report Status PENDING   Incomplete  CULTURE, BLOOD (ROUTINE X 2)     Status: None   Collection Time    02/01/14 10:28 PM      Result Value Ref Range Status   Specimen Description Blood BLOOD LEFT HAND   Final   Special Requests BOTTLES DRAWN AEROBIC AND ANAEROBIC 5CC   Final   Culture PENDING   Incomplete   Report Status PENDING   Incomplete     Studies: Dg Abd Acute W/chest  02/02/2014   CLINICAL DATA:  Fever  EXAM: ACUTE ABDOMEN SERIES (ABDOMEN 2 VIEW & CHEST 1 VIEW)  COMPARISON:  01/30/2014  FINDINGS: Heart size and pulmonary vascularity are within normal limits. Lungs are clear. Negative for pneumonia or effusion.  Contrast in the left colon  from recent CT. Nonobstructive bowel gas pattern. No dilated bowel loops and no air-fluid levels. Negative for free intraperitoneal air. No renal calculi.  IMPRESSION: Negative abdominal radiographs.  No acute cardiopulmonary disease.   Electronically Signed   By: Juanda Crumble  Carlis Abbott M.D.   On: 02/02/2014 08:38    Scheduled Meds: . enoxaparin (LOVENOX) injection  40 mg Subcutaneous Q24H  . insulin aspart  0-9 Units Subcutaneous TID WC  . metoprolol  5 mg Intravenous 3 times per day  . pantoprazole  40 mg Oral Daily   Continuous Infusions: . sodium chloride 50 mL/hr at 02/01/14 2232    Principal Problem:   Small bowel obstruction Active Problems:   Dementia   Nausea vomiting and diarrhea   Thrombocytopenia   Leukocytosis   Hyperglycemia   SBO (small bowel obstruction)   Heme positive stool   Fever, unspecified    Time spent: 30 minutes    [place] [facility] Pager [phone]. If 7PM-7AM, please contact night-coverage at [email or web address], password [facility] 02/02/2014, 10:29 AM  LOS: 3 days

## 2014-02-02 NOTE — Progress Notes (Signed)
  Subjective: Feels much better. Still having bowel movements. They are firming up. No nausea or vomiting. Tolerating regular diet well.  Objective: Vital signs in last 24 hours: Temp:  [98.1 F (36.7 C)-103.2 F (39.6 C)] 100.4 F (38 C) (06/08 1005) Pulse Rate:  [67-84] 73 (06/08 1005) Resp:  [18] 18 (06/08 0410) BP: (145-187)/(51-68) 162/56 mmHg (06/08 1005) SpO2:  [95 %-99 %] 95 % (06/08 1005) Last BM Date: 02/01/14  Intake/Output from previous day: 06/07 0701 - 06/08 0700 In: 2296.7 [P.O.:960; I.V.:1336.7] Out: 1225 [Urine:1225] Intake/Output this shift: Total I/O In: 240 [P.O.:240] Out: 200 [Urine:200]  General appearance: alert, cooperative and no distress GI: soft, non-tender; bowel sounds normal; no masses,  no organomegaly  Lab Results:   Recent Labs  02/01/14 0534 02/02/14 0717  WBC 7.1 10.0  HGB 12.5* 14.2  HCT 36.4* 40.8  PLT 98* 112*   BMET  Recent Labs  02/01/14 0534 02/02/14 0717  NA 138 137  K 3.7 4.1  CL 105 100  CO2 24 25  GLUCOSE 96 115*  BUN 16 12  CREATININE 0.98 0.97  CALCIUM 7.7* 8.3*   PT/INR No results found for this basename: LABPROT, INR,  in the last 72 hours  Studies/Results: Dg Abd Acute W/chest  02/02/2014   CLINICAL DATA:  Fever  EXAM: ACUTE ABDOMEN SERIES (ABDOMEN 2 VIEW & CHEST 1 VIEW)  COMPARISON:  01/30/2014  FINDINGS: Heart size and pulmonary vascularity are within normal limits. Lungs are clear. Negative for pneumonia or effusion.  Contrast in the left colon from recent CT. Nonobstructive bowel gas pattern. No dilated bowel loops and no air-fluid levels. Negative for free intraperitoneal air. No renal calculi.  IMPRESSION: Negative abdominal radiographs.  No acute cardiopulmonary disease.   Electronically Signed   By: Franchot Gallo M.D.   On: 02/02/2014 08:38    Anti-infectives: Anti-infectives   Start     Dose/Rate Route Frequency Ordered Stop   02/02/14 1230  cefTRIAXone (ROCEPHIN) 1 g in dextrose 5 % 50 mL  IVPB     1 g 100 mL/hr over 30 Minutes Intravenous Every 24 hours 02/02/14 1108        Assessment/Plan: Impression: Small bowel obstruction, resolved Plan: Okay for discharge from surgery standpoint. Was instructed to get a colonoscopy scheduled in [place], [place] through his primary care physician.  LOS: 3 days    Jamesetta So 02/02/2014

## 2014-02-02 NOTE — Progress Notes (Signed)
Acetaminophen as patient and agree with Dyanne Carrel, NP assessment and plan.

## 2014-02-02 NOTE — Care Management Note (Addendum)
    Page 1 of 1   02/03/2014     1:34:11 PM CARE MANAGEMENT NOTE 02/03/2014  Patient:  Jose Knox, Jose Knox   Account Number:  [id number]  Date Initiated:  02/02/2014  Documentation initiated by:  Theophilus Kinds  Subjective/Objective Assessment:   Pt admitted from home with SBO. Pt lives with his wife and will return home at discharge. Pt is fairly independent with ADL's. Pt does have a walker for home use. Pts PCP is [facility] in [place]     Action/Plan:   Will continue to follow for discharge planning needs.   Anticipated DC Date:  02/04/2014   Anticipated DC Plan:  [place]  CM consult      [facility] Choice  HOME HEALTH   Choice offered to / List presented to:  C-1 Patient        [place] arranged  HH-1 RN  HH-2 PT      [place] agency  [facility]   Status of service:  Completed, signed off Medicare Important Message given?  YES (If response is "NO", the following Medicare IM given date fields will be blank) Date Medicare IM given:  02/03/2014 Date Additional Medicare IM given:    Discharge Disposition:  HOME/SELF CARE  Per UR Regulation:    If discussed at Long Length of Stay Meetings, dates discussed:    Comments:  02/03/14 [place], RN BSN CM Pt discharged home today with [employer] and PT (per wife choice). Orders sent to [facility] [place] services to start within 48 hours of discharge. PTs PCP appt made and documented on AVS. No DME needs noted. Pt has rolling walker at home. Pt and pts nurse aware of discharge arrangements.  02/02/14 [place], RN BSN CM

## 2014-02-03 DIAGNOSIS — R197 Diarrhea, unspecified: Secondary | ICD-10-CM

## 2014-02-03 DIAGNOSIS — F0390 Unspecified dementia without behavioral disturbance: Secondary | ICD-10-CM

## 2014-02-03 DIAGNOSIS — D696 Thrombocytopenia, unspecified: Secondary | ICD-10-CM

## 2014-02-03 DIAGNOSIS — R32 Unspecified urinary incontinence: Secondary | ICD-10-CM | POA: Diagnosis present

## 2014-02-03 DIAGNOSIS — R112 Nausea with vomiting, unspecified: Secondary | ICD-10-CM

## 2014-02-03 DIAGNOSIS — K56609 Unspecified intestinal obstruction, unspecified as to partial versus complete obstruction: Secondary | ICD-10-CM

## 2014-02-03 LAB — CBC
HCT: 38 % — ABNORMAL LOW (ref 39.0–52.0)
HEMOGLOBIN: 13.3 g/dL (ref 13.0–17.0)
MCH: 32 pg (ref 26.0–34.0)
MCHC: 35 g/dL (ref 30.0–36.0)
MCV: 91.3 fL (ref 78.0–100.0)
Platelets: 111 10*3/uL — ABNORMAL LOW (ref 150–400)
RBC: 4.16 MIL/uL — ABNORMAL LOW (ref 4.22–5.81)
RDW: 12.6 % (ref 11.5–15.5)
WBC: 10.4 10*3/uL (ref 4.0–10.5)

## 2014-02-03 LAB — GLUCOSE, CAPILLARY
GLUCOSE-CAPILLARY: 97 mg/dL (ref 70–99)
Glucose-Capillary: 121 mg/dL — ABNORMAL HIGH (ref 70–99)

## 2014-02-03 LAB — BASIC METABOLIC PANEL
BUN: 12 mg/dL (ref 6–23)
CO2: 23 mEq/L (ref 19–32)
Calcium: 8.1 mg/dL — ABNORMAL LOW (ref 8.4–10.5)
Chloride: 104 mEq/L (ref 96–112)
Creatinine, Ser: 0.97 mg/dL (ref 0.50–1.35)
GFR, EST AFRICAN AMERICAN: 86 mL/min — AB (ref >90)
GFR, EST NON AFRICAN AMERICAN: 74 mL/min — AB (ref >90)
Glucose, Bld: 107 mg/dL — ABNORMAL HIGH (ref 70–99)
POTASSIUM: 3.6 meq/L — AB (ref 3.7–5.3)
SODIUM: 139 meq/L (ref 137–147)

## 2014-02-03 MED ORDER — AMLODIPINE BESYLATE 2.5 MG PO TABS: 2.5000 mg | ORAL_TABLET | Freq: Every day | ORAL | Status: DC

## 2014-02-03 MED ORDER — TAMSULOSIN HCL 0.4 MG PO CAPS: 0.4000 mg | ORAL_CAPSULE | Freq: Every day | ORAL | Status: DC

## 2014-02-03 MED ORDER — CEFUROXIME AXETIL 250 MG PO TABS: 250.0000 mg | ORAL_TABLET | Freq: Two times a day (BID) | ORAL | Status: DC

## 2014-02-03 MED ORDER — POLYETHYLENE GLYCOL 3350 17 G PO PACK
17.0000 g | PACK | Freq: Two times a day (BID) | ORAL | Status: DC
  Administered 2014-02-03: 17 g via ORAL
  Filled 2014-02-03: qty 1

## 2014-02-03 MED ORDER — AMLODIPINE BESYLATE 5 MG PO TABS
2.5000 mg | ORAL_TABLET | Freq: Every day | ORAL | Status: DC
  Administered 2014-02-03: 2.5 mg via ORAL
  Filled 2014-02-03: qty 1

## 2014-02-03 MED ORDER — CEFUROXIME AXETIL 250 MG PO TABS
250.0000 mg | ORAL_TABLET | Freq: Two times a day (BID) | ORAL | Status: DC
  Administered 2014-02-03: 250 mg via ORAL
  Filled 2014-02-03: qty 1

## 2014-02-03 MED ORDER — POTASSIUM CHLORIDE CRYS ER 20 MEQ PO TBCR
40.0000 meq | EXTENDED_RELEASE_TABLET | Freq: Once | ORAL | Status: AC
  Administered 2014-02-03: 40 meq via ORAL
  Filled 2014-02-03: qty 4

## 2014-02-03 MED ORDER — METOPROLOL TARTRATE 12.5 MG HALF TABLET: 12.5000 mg | ORAL_TABLET | Freq: Two times a day (BID) | ORAL | Status: DC

## 2014-02-03 NOTE — Plan of Care (Signed)
Problem: Discharge Progression Outcomes Goal: Activity appropriate for discharge plan Outcome: Completed/Met Date Met:  02/03/14 Out of bed to chair this am.

## 2014-02-03 NOTE — Progress Notes (Signed)
  Subjective: Denies abdominal pain. Can't remember having a bowel movement yesterday.  Objective: Vital signs in last 24 hours: Temp:  [98.1 F (36.7 C)-103.4 F (39.7 C)] 98.1 F (36.7 C) (06/09 0702) Pulse Rate:  [57-82] 63 (06/09 0702) Resp:  [18-19] 18 (06/09 0702) BP: (122-192)/(53-63) 151/53 mmHg (06/09 0702) SpO2:  [95 %-98 %] 97 % (06/09 0702) Last BM Date: 02/02/14  Intake/Output from previous day: 06/08 0701 - 06/09 0700 In: 1163.7 [P.O.:720; I.V.:393.7; IV Piggyback:50] Out: 900 [Urine:900] Intake/Output this shift:    General appearance: alert, cooperative and no distress GI: soft, non-tender; bowel sounds normal; no masses,  no organomegaly  Lab Results:   Recent Labs  02/02/14 0717 02/03/14 0530  WBC 10.0 10.4  HGB 14.2 13.3  HCT 40.8 38.0*  PLT 112* 111*   BMET  Recent Labs  02/01/14 0534 02/02/14 0717  NA 138 137  K 3.7 4.1  CL 105 100  CO2 24 25  GLUCOSE 96 115*  BUN 16 12  CREATININE 0.98 0.97  CALCIUM 7.7* 8.3*   PT/INR No results found for this basename: LABPROT, INR,  in the last 72 hours  Studies/Results: Dg Abd Acute W/chest  02/02/2014   CLINICAL DATA:  Fever  EXAM: ACUTE ABDOMEN SERIES (ABDOMEN 2 VIEW & CHEST 1 VIEW)  COMPARISON:  01/30/2014  FINDINGS: Heart size and pulmonary vascularity are within normal limits. Lungs are clear. Negative for pneumonia or effusion.  Contrast in the left colon from recent CT. Nonobstructive bowel gas pattern. No dilated bowel loops and no air-fluid levels. Negative for free intraperitoneal air. No renal calculi.  IMPRESSION: Negative abdominal radiographs.  No acute cardiopulmonary disease.   Electronically Signed   By: Franchot Gallo M.D.   On: 02/02/2014 08:38    Anti-infectives: Anti-infectives   Start     Dose/Rate Route Frequency Ordered Stop   02/02/14 1230  cefTRIAXone (ROCEPHIN) 1 g in dextrose 5 % 50 mL IVPB     1 g 100 mL/hr over 30 Minutes Intravenous Every 24 hours 02/02/14 1108         Assessment/Plan: Impression: Small bowel obstruction, resolving. Patient does have a UTI. Will add MiraLAX. Patient denying any nausea or vomiting. Continue soft diet.  LOS: 4 days    Jamesetta So 02/03/2014

## 2014-02-03 NOTE — Evaluation (Signed)
Physical Therapy Evaluation Patient Details Name: Jose Knox MRN: [id number] DOB: Oct 25, 1927 Today's Date: 02/03/2014   History of Present Illness  Jose Knox is a very pleasant 80 y.o. male with a history of dementia, colon cancer status post resection in the 1990s, small bowel obstruction, dysphasia status post esophageal dilatation, GERD presents to the hospital from home with the chief complaint of persistent nausea vomiting diarrhea. Information is obtained from the patient and his wife is at the bedside. Of note patient with mild dementia therefore information from him is somewhat unreliable. Wife reports that 3 days ago he developed gradual onset of nausea vomiting and diarrhea. During this time he's been unable to tolerate his food. In addition he said "several" episodes of very loose stool that wife describes as "dark black." In addition he's had intermittent episodes of emesis. She denies any coffee ground emesis. Other associated symptoms include generalized weakness abdominal distention. He denies abdominal pain chest pain shortness of breath fever chills. He does complain of hiccups. He denies dysuria hematuria frequency or urgency.  Clinical Impression  Patient presents to PT after MD referral for assessment of mobility skills.  History received from patient, and patient is a questionable historian.  Patient lives in a single story home with his wife with 3 steps without rails to enter the front door.  Prior to hospitalization, the patient was (I) with bed mobility skills, transfers, and ambulation skills "most of the time"; patient does report his wife helped him as needed.  During evaluation, the patient was mod (I) with bed mobility skills with increased time to complete task, and (I) with transfers though patient did use RW (which was placed in front of him) to stabilize immediate standing balance. Patient was able to ambulate 4 feet forward without AD and min guard, though LOB was  noted with ambulation backwards requiring min assist to correct.  Recommend continued PT services while patient is in the hospital to work on strengthening, balance, and improvement of functional mobility skills, with transition to HHPT at discharge to ensure safe return home.  Recommend patient be discharged with RW for mobility skills to increase balance and safety with ambulation.      Follow Up Recommendations Home health PT    Equipment Recommendations  Rolling walker with 5" wheels       Precautions / Restrictions Precautions Precautions: Fall Restrictions Weight Bearing Restrictions: No      Mobility  Bed Mobility Overal bed mobility: Modified Independent             General bed mobility comments: Increased time to complete tasks.  VC to avoid use of handrails, as bed at home does not have rails.   Transfers Overall transfer level: Independent Equipment used: Rolling walker (2 wheeled)             General transfer comment: Patient did use RW once in standing to assist with standing balance  Ambulation/Gait Ambulation/Gait assistance: Min guard;Min assist Ambulation Distance (Feet): 8 Feet Assistive device: Rolling walker (2 wheeled) Gait Pattern/deviations: Shuffle;Decreased step length - left;Decreased step length - right   Gait velocity interpretation: Below normal speed for age/gender General Gait Details: Shuffled pattern, despite VC to increase step length.  Patient ambulated 4 feet forward without AD and min guard, though required use of RW and min assist with LOB when at EOB.         Balance Overall balance assessment: Needs assistance Sitting-balance support: Feet supported;Single extremity supported Sitting balance-Leahy Scale:  Good Sitting balance - Comments: Patient able to maintain sitting balance with (B) feet supported and 1 UE support during moderate pertebations   Standing balance support: During functional activity (Use of RW for  immediate standing balance after transfer, no AD with gait forward)                                 Pertinent Vitals/Pain No pain reported.     Home Living Family/patient expects to be discharged to:: Private residence Living Arrangements: Spouse/significant other Available Help at Discharge: Family Type of Home: House Home Access: Stairs to enter Entrance Stairs-Rails: None Entrance Stairs-Number of Steps: 3 Home Layout: One level Home Equipment: None      Prior Function Level of Independence: Needs assistance   Gait / Transfers Assistance Needed: Patient is a questionable historian.  Patient reports his wife helps him as needed for mobility skills.               Extremity/Trunk Assessment               Lower Extremity Assessment: Generalized weakness;RLE deficits/detail;LLE deficits/detail RLE Deficits / Details: MMT hip 4-/5, knee 4/5 LLE Deficits / Details: MMT hip 4-/5, knee 4/5     Communication   Communication: No difficulties  Cognition Arousal/Alertness: Awake/alert Behavior During Therapy: WFL for tasks assessed/performed                                 Assessment/Plan    PT Assessment Patient needs continued PT services  PT Diagnosis Generalized weakness;Difficulty walking   PT Problem List Decreased strength;Decreased activity tolerance;Decreased balance;Decreased mobility  PT Treatment Interventions Gait training;Neuromuscular re-education;Balance training;Stair training;Functional mobility training;Therapeutic activities;Therapeutic exercise   PT Goals (Current goals can be found in the Care Plan section) Acute Rehab PT Goals PT Goal Formulation: With patient Time For Goal Achievement: 02/17/14 Potential to Achieve Goals: Good    Frequency Min 3X/week    End of Session Equipment Utilized During Treatment: Gait belt Activity Tolerance: Patient tolerated treatment well Patient left: in chair;with call bell/phone  within reach;with chair alarm set           Time: 7017-7939 PT Time Calculation (min): 13 min   Charges:   PT Evaluation $Initial PT Evaluation Tier I: 1 Procedure     Lonna Cobb 02/03/2014, 8:43 AM

## 2014-02-03 NOTE — Discharge Summary (Signed)
Physician Discharge Summary  FRANKLIN BAUMBACH HCW:[id number] DOB: [birth year] DOA: 01/30/2014  PCP: Su Monks, MD  Admit date: 01/30/2014 Discharge date: 02/03/2014  Time spent: 40 minutes  Recommendations for Outpatient Follow-up:  1. Patient has appointment 02/04/14 with PCP for follow up of symptoms. Recommend BMET to evaluate potassium level. Close follow up to BP control as antihypertensive medications started. Urinalysis to ensure resolution of UTI. May benefit from colonoscopy given hx colon cancer. Follow urinary incontinence.  2. Home health PT and RN for skilled observation and assessment and strength and endurance.  Discharge Diagnoses:  Principal Problem:   Small bowel obstruction Active Problems:   Dementia   Nausea vomiting and diarrhea   Thrombocytopenia   Leukocytosis   Hyperglycemia   SBO (small bowel obstruction)   Heme positive stool   Fever, unspecified   UTI (urinary tract infection)   Essential hypertension, benign   Discharge Condition: stable  Diet recommendation: soft diet  Filed Weights   01/30/14 0955 01/30/14 1515  Weight: 70.308 kg (155 lb) 68.5 kg (151 lb 0.2 oz)    History of present illness:  Jose Knox is a very pleasant 80 y.o. male with a history of dementia, colon cancer status post resection in the 1990s, small bowel obstruction, dysphasia status post esophageal dilatation, GERD presented to the hospital on 01/30/14 from home with the chief complaint of persistent nausea vomiting diarrhea. Information obtained from the patient and his wife at the bedside. Of note patient with mild dementia therefore information from him is somewhat unreliable. Wife reported that 3 days prior he developed gradual onset of nausea vomiting and diarrhea. During this time he'd been unable to tolerate his food. In addition he had "several" episodes of very loose stool that wife described as "dark Carman Essick." In addition he'd had intermittent episodes of emesis. She  denied any coffee ground emesis. Other associated symptoms included generalized weakness abdominal distention. He denied abdominal pain chest pain shortness of breath fever chills. He did complain of hiccups. He denied dysuria hematuria frequency or urgency. Initial evaluation in the emergency department included a CT of the abdomen and pelvis which revealed a small bowel obstruction proximal to mid ileum likely related to adhesions.  He denied abdominal pain. He was hemodynamically stable afebrile and not hypoxic  Hospital Course:  #1 small bowel obstruction   Admitted to tele bed. He had no nausea no vomiting no abdominal pain during this hospitalization. He was provided with bowel rest and supportive therapy. He began to pass flatus and having bowel movement. His diet was advanced and he is tolerating soft foods at discharge. Follow up with PCP 02/04/14 as scheduled.  #2 nausea vomiting and diarrhea  Likely secondary to problem #1. No further nausea or vomiting. Stool is negative for C. difficile PCR. At discharge all resolved.   #3 leukocytosis  Likely reactive leukocytosis secondary to problem #1. Leukocytosis resolved.   #4 thrombocytopenia  Stable during this hospitalization.  #5 dementia  Stable.  #6 prophylaxis  Protonix for GI prophylaxis. Lovenox for DVT prophylaxis.  #6 UTI  Urinalysis +nitirite, small leuk, many bacteria and TNTC WBC. Await urine culture in meantime provided rocephin. Will be discharged with Ceftin for 6 days to complete 10 day course.  #7 fever  Likely related to UTI. Xray of abdomen and chest without active disease/infiltrate. Lactic acid within limits of normal. Blood cultures with no growth. Antibiotics as above  #8 HTN  No hx of same. Not on antihypertensive medication at home.  Required metoprolol IV TID during this hospitalization with only fair control. Oral agent introduced.  At discharge provided with amlodipine, lopressor and flomax. At discharge SBP range  120-150 and DBP in 50's. Recommend close follow up with PCP for optimal control #Urinary incontinence May be related to UTI. flomax started. Recommend close OP follow up.  Procedures:  none  Consultations:  Dr Arnoldo Morale general surgery  Discharge Exam: Filed Vitals:   02/03/14 1127  BP: 154/55  Pulse:   Temp:   Resp:     General: thin somewhat frail appearing  Cardiovascular: RRR No MGR No LE edema Respiratory: normal effort BS clear bilaterally no rhonchi or crackles Abdomen: round but soft +BS throughout. Non-tender to palpation  Discharge Instructions You were cared for by a hospitalist during your hospital stay. If you have any questions about your discharge medications or the care you received while you were in the hospital after you are discharged, you can call the unit and asked to speak with the hospitalist on call if the hospitalist that took care of you is not available. Once you are discharged, your primary care physician will handle any further medical issues. Please note that NO REFILLS for any discharge medications will be authorized once you are discharged, as it is imperative that you return to your primary care physician (or establish a relationship with a primary care physician if you do not have one) for your aftercare needs so that they can reassess your need for medications and monitor your lab values.     Medication List         amLODipine 2.5 MG tablet  Commonly known as:  NORVASC  Take 1 tablet (2.5 mg total) by mouth daily.     cefUROXime 250 MG tablet  Commonly known as:  CEFTIN  Take 1 tablet (250 mg total) by mouth 2 (two) times daily with a meal.     diazepam 5 MG tablet  Commonly known as:  VALIUM  Take 1 tablet by mouth at bedtime.     metoprolol tartrate 12.5 mg Tabs tablet  Commonly known as:  LOPRESSOR  Take 0.5 tablets (12.5 mg total) by mouth 2 (two) times daily.     pantoprazole 20 MG tablet  Commonly known as:  PROTONIX  Take 1  tablet by mouth 2 (two) times daily.     tamsulosin 0.4 MG Caps capsule  Commonly known as:  FLOMAX  Take 1 capsule (0.4 mg total) by mouth daily after supper.     Vitamin D2 2000 UNITS Tabs  Take 1 tablet by mouth daily.       No Known Allergies     Follow-up Information   Follow up with Su Monks, MD On 02/04/2014. (8:30)    Specialty:  Internal Medicine   Contact information:   [place] [id number] [phone]        The results of significant diagnostics from this hospitalization (including imaging, microbiology, ancillary and laboratory) are listed below for reference.    Significant Diagnostic Studies: Dg Chest 2 View  01/30/2014   CLINICAL DATA:  Nausea and vomiting  EXAM: CHEST  2 VIEW  COMPARISON:  None.  FINDINGS: The cardiac shadow is within normal limits. The lungs are clear bilaterally. An air-fluid level is noted within the gastric lumen. No gross bony abnormality is noted.  IMPRESSION: No active cardiopulmonary disease.   Electronically Signed   By: Inez Catalina M.D.   On: 01/30/2014 11:57  Dg Abd 1 View  01/31/2014   CLINICAL DATA:  Followup of small bowel obstruction.  EXAM: ABDOMEN - 1 VIEW  COMPARISON:  CT of 1 day prior  FINDINGS: Supine view of the abdomen and pelvis. Mid small bowel loops measure up to the 3.3 cm, similar to on the prior exam. Gas and contrast identified throughout the colon. Contrast in the urinary bladder as well. No pneumatosis or free intraperitoneal air.  IMPRESSION: Similar partial small bowel obstruction.   Electronically Signed   By: Abigail Miyamoto M.D.   On: 01/31/2014 09:21   Ct Abdomen Pelvis W Contrast  01/30/2014   CLINICAL DATA:  History of colon carcinoma an abdominal pain  EXAM: CT ABDOMEN AND PELVIS WITH CONTRAST  TECHNIQUE: Multidetector CT imaging of the abdomen and pelvis was performed using the standard protocol following bolus administration of intravenous contrast.  CONTRAST:  52mL OMNIPAQUE  IOHEXOL 300 MG/ML SOLN, 175mL OMNIPAQUE IOHEXOL 300 MG/ML SOLN  COMPARISON:  None.  FINDINGS: Lung bases are free of acute infiltrate or sizable effusion. Atherosclerotic changes are noted in the descending thoracic aorta as well as within the abdominal aorta.  The liver, gallbladder, spleen, pancreas and adrenal glands are within normal limits. Kidneys are well visualized bilaterally and reveal renal cystic change on the right. No calculi or obstructive changes are seen.  There is significant distention of the stomach, jejunum and proximal ileum. An area of relative caliber change is noted in the proximal to mid ileum best seen on image number 82 of series 2. These changes are likely related to a adhesions from patient's known prior surgery as no definitive mass lesion is seen. The more distal ileum is fluid filled but otherwise within normal limits. These changes are consistent with a partial small bowel obstruction.  The bladder is well distended. No pelvic mass lesion or sidewall abnormality is seen. A small amount of free pelvic fluid is noted. Postsurgical changes are noted in the rectum. The osseous structures show no acute abnormality. 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  IMPRESSION: Partial small bowel obstruction in the proximal to mid ileum likely related to adhesions.  No other acute abnormality is seen.   Electronically Signed   By: Inez Catalina M.D.   On: 01/30/2014 12:42   Dg Abd Acute W/chest  02/02/2014   CLINICAL DATA:  Fever  EXAM: ACUTE ABDOMEN SERIES (ABDOMEN 2 VIEW & CHEST 1 VIEW)  COMPARISON:  01/30/2014  FINDINGS: Heart size and pulmonary vascularity are within normal limits. Lungs are clear. Negative for pneumonia or effusion.  Contrast in the left colon from recent CT. Nonobstructive bowel gas pattern. No dilated bowel loops and no air-fluid levels. Negative for free intraperitoneal air. No renal calculi.  IMPRESSION: Negative abdominal radiographs.  No  acute cardiopulmonary disease.   Electronically Signed   By: Franchot Gallo M.D.   On: 02/02/2014 08:38    Microbiology: Recent Results (from the past 240 hour(s))  URINE CULTURE     Status: None   Collection Time    01/30/14  3:20 PM      Result Value Ref Range Status   Specimen Description URINE, CLEAN CATCH   Final   Special Requests NONE   Final   Culture  Setup Time     Final   Value: 01/30/2014 23:36     Performed at [facility]     Final   Value: 35,000 COLONIES/ML  Performed at [facility]     Final   Value: Multiple bacterial morphotypes present, none predominant. Suggest appropriate recollection if clinically indicated.     Performed at [facility]   Report Status 01/31/2014 FINAL   Final  CLOSTRIDIUM DIFFICILE BY PCR     Status: None   Collection Time    01/31/14  2:08 AM      Result Value Ref Range Status   C difficile by pcr NEGATIVE  NEGATIVE Final  CULTURE, BLOOD (ROUTINE X 2)     Status: None   Collection Time    01/31/14  6:21 AM      Result Value Ref Range Status   Specimen Description BLOOD RIGHT ARM   Final   Special Requests BAA 8 CC EACH   Final   Culture NO GROWTH 2 DAYS   Final   Report Status PENDING   Incomplete  CULTURE, BLOOD (ROUTINE X 2)     Status: None   Collection Time    01/31/14  6:21 AM      Result Value Ref Range Status   Specimen Description BLOOD LEFT HAND   Final   Special Requests  BAA 6 CC EACH   Final   Culture NO GROWTH 2 DAYS   Final   Report Status PENDING   Incomplete  CULTURE, BLOOD (ROUTINE X 2)     Status: None   Collection Time    02/01/14 10:21 PM      Result Value Ref Range Status   Specimen Description Blood LEFT ANTECUBITAL   Final   Special Requests BOTTLES DRAWN AEROBIC AND ANAEROBIC [place]   Final   Culture NO GROWTH 1 DAY   Final   Report Status PENDING   Incomplete  CULTURE, BLOOD (ROUTINE X 2)     Status: None   Collection Time    02/01/14 10:28 PM       Result Value Ref Range Status   Specimen Description Blood BLOOD LEFT HAND   Final   Special Requests BOTTLES DRAWN AEROBIC AND ANAEROBIC 5CC   Final   Culture NO GROWTH 1 DAY   Final   Report Status PENDING   Incomplete     Labs: Basic Metabolic Panel:  Recent Labs Lab 01/30/14 1115 01/31/14 0621 02/01/14 0534 02/02/14 0717 02/03/14 0530  NA 137 136* 138 137 139  K 4.2 3.8 3.7 4.1 3.6*  CL 97 99 105 100 104  CO2 27 22 24 25 23   GLUCOSE 139* 101* 96 115* 107*  BUN 17 19 16 12 12   CREATININE 0.90 0.96 0.98 0.97 0.97  CALCIUM 9.8 8.1* 7.7* 8.3* 8.1*  MG  --  1.6 2.5  --   --    Liver Function Tests:  Recent Labs Lab 01/30/14 1115  AST 29  ALT 22  ALKPHOS 67  BILITOT 1.3*  PROT 7.3  ALBUMIN 4.1    Recent Labs Lab 01/30/14 1115  LIPASE 12   No results found for this basename: AMMONIA,  in the last 168 hours CBC:  Recent Labs Lab 01/30/14 1115 01/31/14 0621 02/01/14 0534 02/02/14 0717 02/03/14 0530  WBC 13.5* 9.2 7.1 10.0 10.4  NEUTROABS 11.1*  --   --   --   --   HGB 16.2 13.8 12.5* 14.2 13.3  HCT 45.8 39.9 36.4* 40.8 38.0*  MCV 91.1 91.5 92.6 91.7 91.3  PLT 130* 127* 98* 112* 111*   Cardiac Enzymes:  Recent Labs Lab  01/30/14 1115  TROPONINI <0.30   BNP: BNP (last 3 results) No results found for this basename: PROBNP,  in the last 8760 hours CBG:  Recent Labs Lab 02/02/14 1119 02/02/14 1636 02/02/14 2052 02/03/14 0724 02/03/14 1124  GLUCAP 139* 128* 119* 97 121*       Signed:  Lezlie Octave Roselle Norton  [facility] 02/03/2014, 12:18 PM

## 2014-02-03 NOTE — Progress Notes (Signed)
Patient discharged home with wife,instructions were given on medications,and follow up visits,both verbalized understanding.Prescriptions sent with patient. [facility],is to follow up with patient at home.No c/o pain or discomfort noted. Accompanied by staff to an awaiting.

## 2014-02-04 LAB — URINE CULTURE: Colony Count: >=100000

## 2014-02-05 LAB — CULTURE, BLOOD (ROUTINE X 2)
CULTURE: NO GROWTH
Culture: NO GROWTH

## 2014-02-05 NOTE — Discharge Summary (Signed)
I have seen and assessed patient and agree with Dyanne Carrel, NP discharge summary.

## 2014-02-07 LAB — CULTURE, BLOOD (ROUTINE X 2)
Culture: NO GROWTH
Culture: NO GROWTH

## 2014-04-12 ENCOUNTER — Encounter (HOSPITAL_COMMUNITY): Payer: Self-pay | Admitting: Emergency Medicine

## 2014-04-12 ENCOUNTER — Emergency Department (HOSPITAL_COMMUNITY)
Admission: EM | Admit: 2014-04-12 | Discharge: 2014-04-12 | Disposition: A | Discharge status: Home / Self Care | Payer: Medicare Other | Attending: Emergency Medicine | Admitting: Emergency Medicine

## 2014-04-12 ENCOUNTER — Emergency Department (HOSPITAL_COMMUNITY): Payer: Medicare Other

## 2014-04-12 DIAGNOSIS — Z85038 Personal history of other malignant neoplasm of large intestine: Secondary | ICD-10-CM | POA: Insufficient documentation

## 2014-04-12 DIAGNOSIS — K5900 Constipation, unspecified: Secondary | ICD-10-CM | POA: Insufficient documentation

## 2014-04-12 DIAGNOSIS — Z792 Long term (current) use of antibiotics: Secondary | ICD-10-CM | POA: Insufficient documentation

## 2014-04-12 DIAGNOSIS — Z8659 Personal history of other mental and behavioral disorders: Secondary | ICD-10-CM | POA: Diagnosis not present

## 2014-04-12 DIAGNOSIS — R110 Nausea: Secondary | ICD-10-CM | POA: Diagnosis not present

## 2014-04-12 DIAGNOSIS — K219 Gastro-esophageal reflux disease without esophagitis: Secondary | ICD-10-CM | POA: Diagnosis not present

## 2014-04-12 DIAGNOSIS — Z79899 Other long term (current) drug therapy: Secondary | ICD-10-CM | POA: Insufficient documentation

## 2014-04-12 LAB — CBC WITH DIFFERENTIAL/PLATELET
Basophils Absolute: 0 10*3/uL (ref 0.0–0.1)
Basophils Relative: 0 % (ref 0–1)
Eosinophils Absolute: 0.2 10*3/uL (ref 0.0–0.7)
Eosinophils Relative: 2 % (ref 0–5)
HCT: 43.8 % (ref 39.0–52.0)
Hemoglobin: 15.6 g/dL (ref 13.0–17.0)
Lymphocytes Relative: 34 % (ref 12–46)
Lymphs Abs: 2.7 10*3/uL (ref 0.7–4.0)
MCH: 32.4 pg (ref 26.0–34.0)
MCHC: 35.6 g/dL (ref 30.0–36.0)
MCV: 91.1 fL (ref 78.0–100.0)
Monocytes Absolute: 0.7 10*3/uL (ref 0.1–1.0)
Monocytes Relative: 9 % (ref 3–12)
Neutro Abs: 4.3 10*3/uL (ref 1.7–7.7)
Neutrophils Relative %: 55 % (ref 43–77)
Platelets: 137 10*3/uL — ABNORMAL LOW (ref 150–400)
RBC: 4.81 MIL/uL (ref 4.22–5.81)
RDW: 12.8 % (ref 11.5–15.5)
WBC: 7.8 10*3/uL (ref 4.0–10.5)

## 2014-04-12 LAB — BASIC METABOLIC PANEL
Anion gap: 14 (ref 5–15)
BUN: 12 mg/dL (ref 6–23)
CO2: 28 mEq/L (ref 19–32)
Calcium: 9.5 mg/dL (ref 8.4–10.5)
Chloride: 100 mEq/L (ref 96–112)
Creatinine, Ser: 0.96 mg/dL (ref 0.50–1.35)
GFR calc Af Amer: 86 mL/min — ABNORMAL LOW (ref >90)
GFR calc non Af Amer: 75 mL/min — ABNORMAL LOW (ref >90)
Glucose, Bld: 104 mg/dL — ABNORMAL HIGH (ref 70–99)
Potassium: 3.8 mEq/L (ref 3.7–5.3)
Sodium: 142 mEq/L (ref 137–147)

## 2014-04-12 MED ORDER — MAGNESIUM HYDROXIDE 400 MG/5ML PO SUSP
30.0000 mL | Freq: Once | ORAL | Status: AC
  Administered 2014-04-12: 30 mL via ORAL
  Filled 2014-04-12: qty 30

## 2014-04-12 MED ORDER — POLYETHYLENE GLYCOL 3350 17 G PO PACK
34.0000 g | PACK | Freq: Once | ORAL | Status: AC
  Administered 2014-04-12: 34 g via ORAL
  Filled 2014-04-12: qty 2

## 2014-04-12 MED ORDER — IOHEXOL 300 MG/ML  SOLN
50.0000 mL | Freq: Once | INTRAMUSCULAR | Status: AC | PRN
  Administered 2014-04-12: 50 mL via ORAL

## 2014-04-12 NOTE — ED Notes (Signed)
AC called for enema

## 2014-04-12 NOTE — Discharge Instructions (Signed)

## 2014-04-12 NOTE — ED Notes (Signed)
Pt unable to hold enema. A small amount of stool returned. Also, some bright bleeding noted. EDP aware. Wife states when she gave him an enema art home the same thing happened

## 2014-04-12 NOTE — ED Notes (Signed)
No BM for a week and has taken Magnesium citrate without relief

## 2014-04-12 NOTE — ED Notes (Signed)
Pt states he has completed the contrast. CT notified.

## 2014-04-12 NOTE — ED Notes (Signed)
Wife upset with care. States she feels they are killing time waiting. States her husband has had numerous medications all week and still has not had a bowel movement. She feels they need to be referred to a surgeon. EDP aware and back in to talk with wife and pt

## 2014-04-12 NOTE — ED Notes (Signed)
No result from medication yet

## 2014-04-12 NOTE — ED Notes (Signed)
Family at bedside. Patient is still having loose stool.BP elevated RN made aware. Patient's wife states that he gets agitated really easily.

## 2014-04-12 NOTE — ED Provider Notes (Signed)
CSN: [id number]     Arrival date & time 04/12/14  1505 History  This chart was scribed for Virgel Manifold, MD by Starleen Arms, ED Scribe. This patient was seen in room APA02/APA02 and the patient's care was started at 3:36 PM.    Chief Complaint  Patient presents with  . Constipation    The history is provided by the patient and the spouse. No language interpreter was used.    HPI Comments: Jose Knox is a 80 y.o. male who presents to the Emergency Department complaining of constipation with associated abdominal pain and nausea, without vomiting.  Per patient his last normal bowel movement was either yesterday or the day before.  However the wife states that he has been constipated for 1 week and is more bloated currently than normal.  She states she contacted his PCP who advised her to give him magnesium citrate.  Patient has a history of constipation and bowel obstruction.  He also has a history of colon cancer.  Patient cannot recall if this current episode is similar to his previous episode of bowel obstruction.  Patient denies other pain, medication changes.  Past Medical History  Diagnosis Date  . Dementia   . Small bowel obstruction 2009  . Colon cancer 1990's  . GERD (gastroesophageal reflux disease)    Past Surgical History  Procedure Laterality Date  . Colon resection      multiple surgeries in [place], [place]   History reviewed. No pertinent family history. History  Substance Use Topics  . Smoking status: Never Smoker   . Smokeless tobacco: Not on file  . Alcohol Use: No    Review of Systems  Gastrointestinal: Positive for nausea, abdominal pain and constipation. Negative for vomiting.  All other systems reviewed and are negative.     Allergies  Review of patient's allergies indicates no known allergies.  Home Medications   Prior to Admission medications   Medication Sig Start Date End Date Taking? Authorizing Provider  amLODipine (NORVASC) 2.5 MG tablet  Take 1 tablet (2.5 mg total) by mouth daily. 02/03/14   Eugenie Filler, MD  cefUROXime (CEFTIN) 250 MG tablet Take 1 tablet (250 mg total) by mouth 2 (two) times daily with a meal. Take for 6 days then stop. 02/03/14   Eugenie Filler, MD  diazepam (VALIUM) 5 MG tablet Take 1 tablet by mouth at bedtime. 12/27/13   Historical Provider, MD  Ergocalciferol (VITAMIN D2) 2000 UNITS TABS Take 1 tablet by mouth daily.    Historical Provider, MD  metoprolol tartrate (LOPRESSOR) 12.5 mg TABS tablet Take 0.5 tablets (12.5 mg total) by mouth 2 (two) times daily. 02/03/14   Radene Gunning, NP  pantoprazole (PROTONIX) 20 MG tablet Take 1 tablet by mouth 2 (two) times daily. 12/03/13   Historical Provider, MD  tamsulosin (FLOMAX) 0.4 MG CAPS capsule Take 1 capsule (0.4 mg total) by mouth daily after supper. 02/03/14   Eugenie Filler, MD   BP 223/73  Pulse 66  Temp(Src) 98.9 F (37.2 C) (Oral)  Ht 5\' 10"  (1.778 m)  Wt 138 lb (62.596 kg)  BMI 19.80 kg/m2  SpO2 100% Physical Exam  Nursing note and vitals reviewed. Constitutional: He appears well-developed and well-nourished. No distress.  HENT:  Head: Normocephalic and atraumatic.  Eyes: Conjunctivae and EOM are normal.  Neck: Neck supple. No tracheal deviation present.  Cardiovascular: Normal rate.   Pulmonary/Chest: Effort normal. No respiratory distress.  Abdominal: Soft. He exhibits distension (mild). There  is no tenderness.  Musculoskeletal: Normal range of motion.  Neurological: He is alert.  Skin: Skin is warm and dry.  Psychiatric: He has a normal mood and affect. His behavior is normal.    ED Course  Procedures (including critical care time)  DIAGNOSTIC STUDIES: Oxygen Saturation is 100% on RA, normal by my interpretation.    COORDINATION OF CARE:  3:42 PM Discussed treatment plan with patient at bedside.  Patient acknowledges and agrees with plan.    Labs Review Labs Reviewed  BASIC METABOLIC PANEL - Abnormal; Notable for the  following:    Glucose, Bld 104 (*)    GFR calc non Af Amer 75 (*)    GFR calc Af Amer 86 (*)    All other components within normal limits  CBC WITH DIFFERENTIAL - Abnormal; Notable for the following:    Platelets 137 (*)    All other components within normal limits    Imaging Review No results found.  Ct Abdomen Pelvis Wo Contrast  04/12/2014   CLINICAL DATA:  Abdominal pain. Constipation. History of prior colon surgery.  EXAM: CT ABDOMEN AND PELVIS WITHOUT CONTRAST  TECHNIQUE: Multidetector CT imaging of the abdomen and pelvis was performed following the standard protocol without IV contrast.  COMPARISON:  CT scan 02/02/2014.  FINDINGS: Lung bases:  Clear. The heart is normal in size. No pericardial effusion. The distal esophagus is grossly normal.  CT abdomen:  The unenhanced appearance of the liver is unremarkable. No focal lesions or biliary dilatation. The gallbladder demonstrates small gallstones but no definite findings for acute cholecystitis. No common bile duct dilatation. The pancreas is grossly normal. The spleen is normal in size. No focal lesions. The adrenal glands and kidneys are unremarkable except for bilateral cysts. No renal or obstructing ureteral calculi.  The stomach, duodenum, small bowel and colon are unremarkable. No inflammatory changes, mass lesions or obstructive findings. There are stable surgical changes at the rectosigmoid junction from a prior anastomosis. There is a chronic postoperative rim calcified fluid collection the presacral space. No pelvic mass or adenopathy. No mesenteric or retroperitoneal mass or adenopathy. The aorta demonstrates stable atherosclerotic calcifications.  CT pelvis:  The bladder, prostate gland and seminal vesicles are unremarkable. No pelvic mass or adenopathy. No inguinal mass or adenopathy.  Bony structures:  No acute bony findings. There is a stable benign cyst in the left femoral neck. No worrisome bone lesions.  IMPRESSION: No acute  abdominal/ pelvic findings, mass lesions or adenopathy.  Stable postoperative changes at the rectosigmoid junction region.   Electronically Signed   By: Kalman Jewels M.D.   On: 04/12/2014 22:07   Dg Abd 2 Views  04/12/2014   CLINICAL DATA:  Abdominal pain.  Constipation.  EXAM: ABDOMEN - 2 VIEW  COMPARISON:  02/02/2014 and 01/31/2014  FINDINGS: There is extensive stool in the left side of the colon but there is no fecal impaction. There are air-fluid levels in the ascending colon. There are no dilated small bowel loops. No worrisome abdominal calcifications. Osseous structures are normal. No free air.  IMPRESSION: Extensive stool  in the nondistended colon.   Electronically Signed   By: Rozetta Nunnery M.D.   On: 04/12/2014 16:10    EKG Interpretation None      MDM   Final diagnoses:  Constipation, unspecified constipation type    80 year old male with some abdominal distention and constipation. Several bowel movements in the emergency room. X-ray with air-fluid levels, but CT without signs of obstruction.  Feeling better. I feel safe for discharge. Bowel regimen was discussed with wife.  I personally preformed the services scribed in my presence. The recorded information has been reviewed is accurate. Virgel Manifold, MD.   Virgel Manifold, MD 04/15/14 [phone]

## 2014-04-12 NOTE — ED Notes (Signed)
Pt having another bowel movement. Producing a large amount of loose watery stool.

## 2014-04-12 NOTE — ED Notes (Signed)
Pt up to bathroom. Wife states he is having a bowel movement and he probably does not need the enema now

## 2014-12-28 IMAGING — CT CT ABD-PELV W/ CM
2 of 4 series · 15 of 46 positions shown, 17 images · IV contrast (Omnipaque 300)
Comparison: None.

CLINICAL DATA: History of colon carcinoma an abdominal pain

EXAM:
CT ABDOMEN AND PELVIS WITH CONTRAST
TECHNIQUE: Multidetector CT imaging of the abdomen and pelvis was performed
using the standard protocol following bolus administration of
intravenous contrast.
CONTRAST:  50mL OMNIPAQUE IOHEXOL 300 MG/ML SOLN, 100mL OMNIPAQUE
IOHEXOL 300 MG/ML SOLN

[Series 2: abd_pel_with 5.0 b40f · axial · 0.68mm/px · z∈[+323,+798]mm · 12 of 105 slices shown, 14 images]
[im 5/105  soft-tissue]
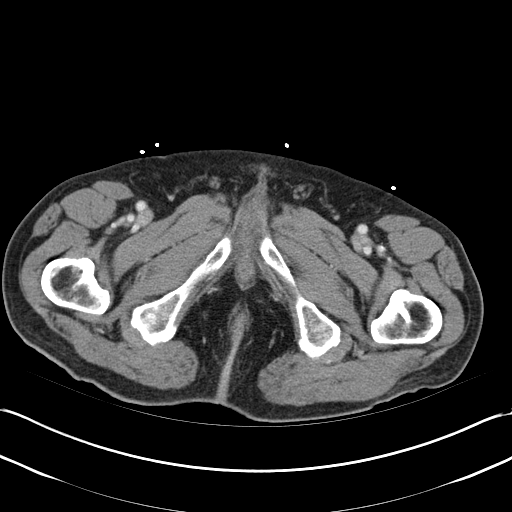
[im 5/105  bone]
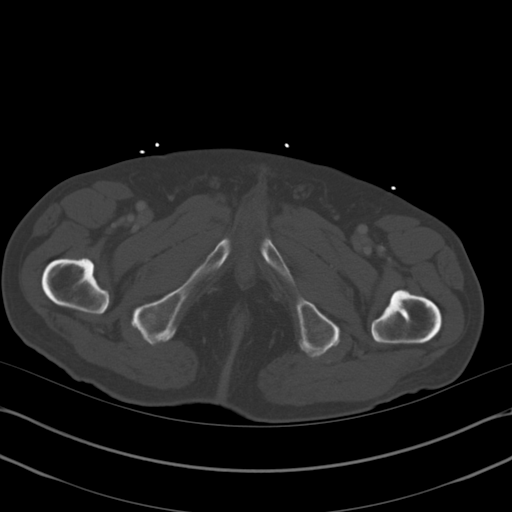
[im 15/105  soft-tissue]
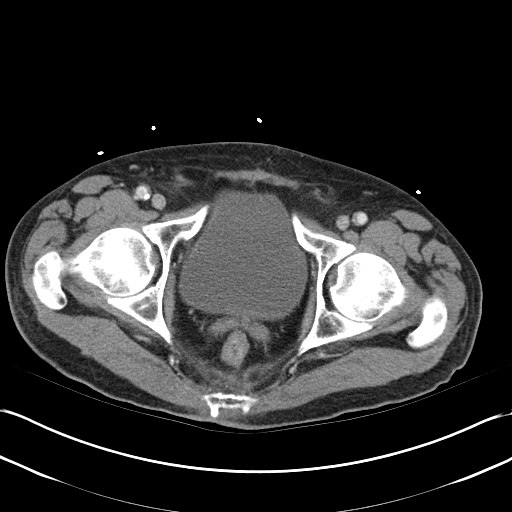
[im 25/105  soft-tissue]
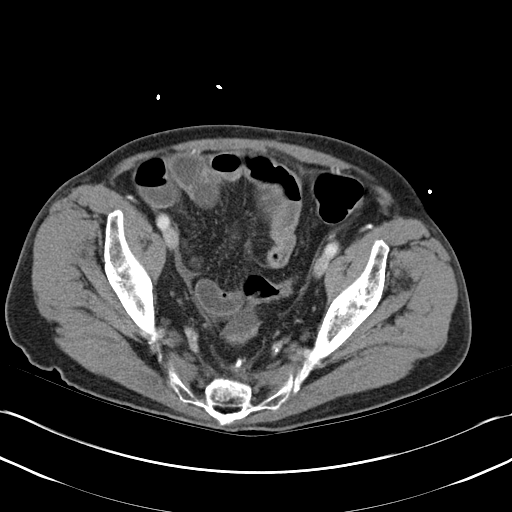
[im 30/105  soft-tissue]
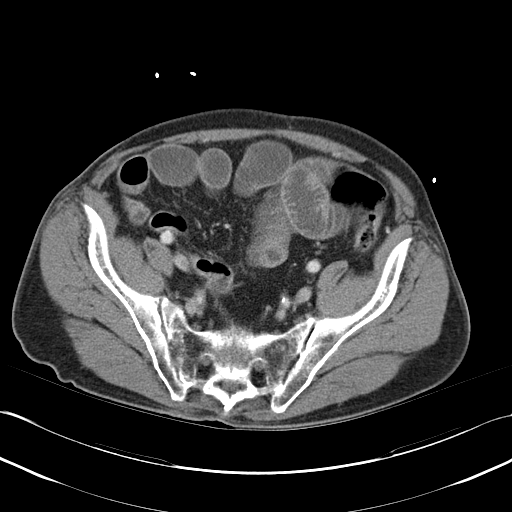
[im 40/105  soft-tissue]
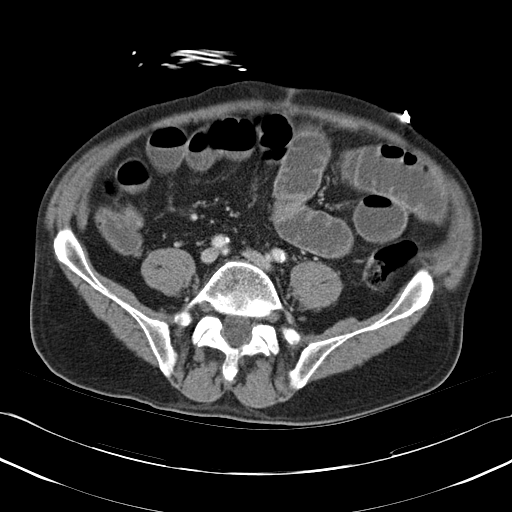
[im 50/105  soft-tissue]
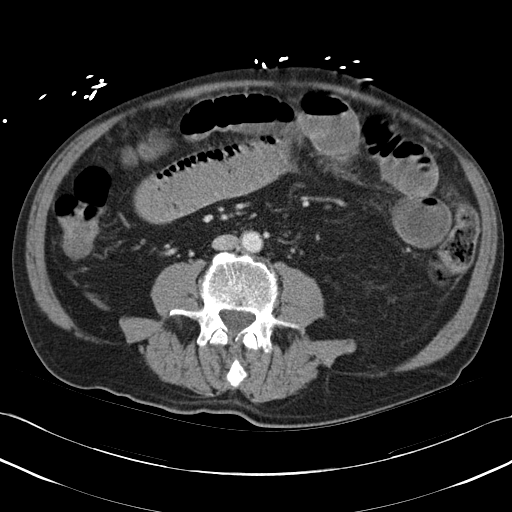
[im 55/105  soft-tissue]
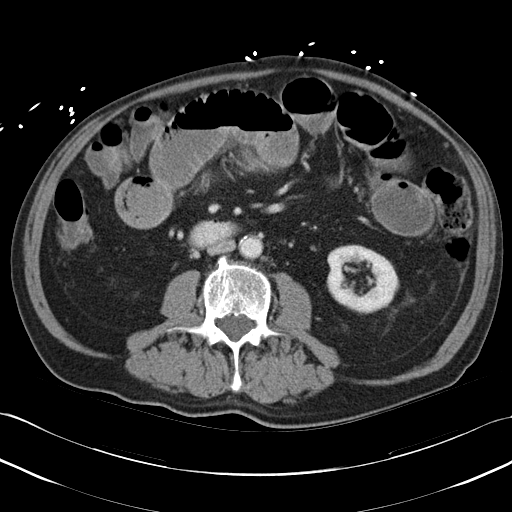
[im 65/105  soft-tissue]
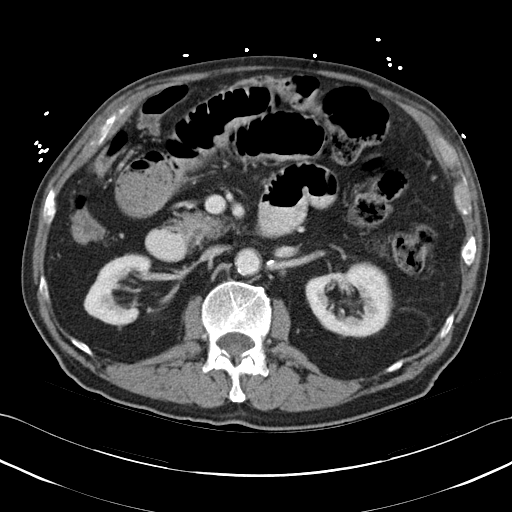
[im 75/105  soft-tissue]
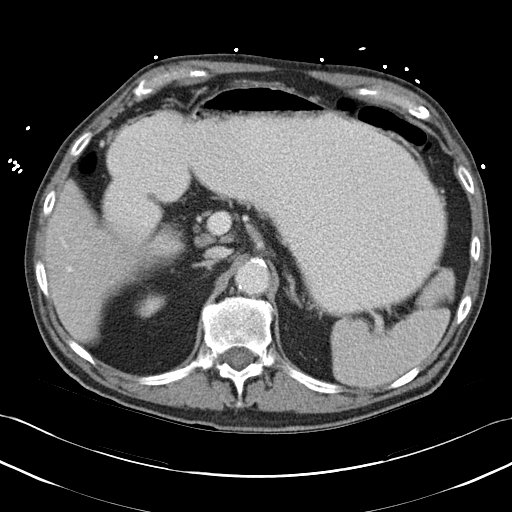
[im 75/105  bone]
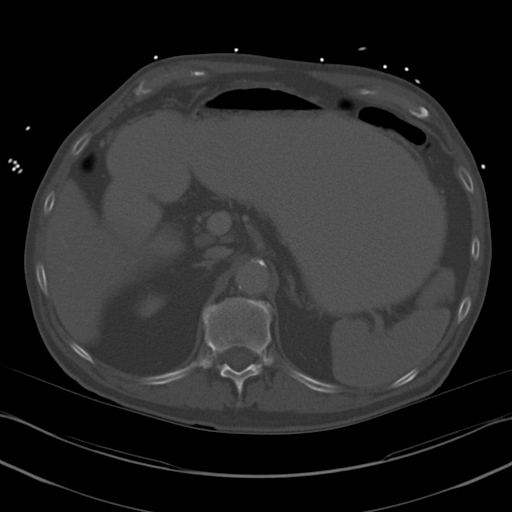
[im 80/105  soft-tissue]
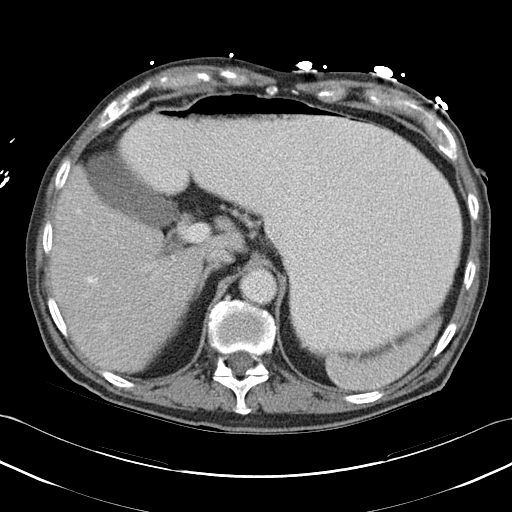
[im 90/105  soft-tissue]
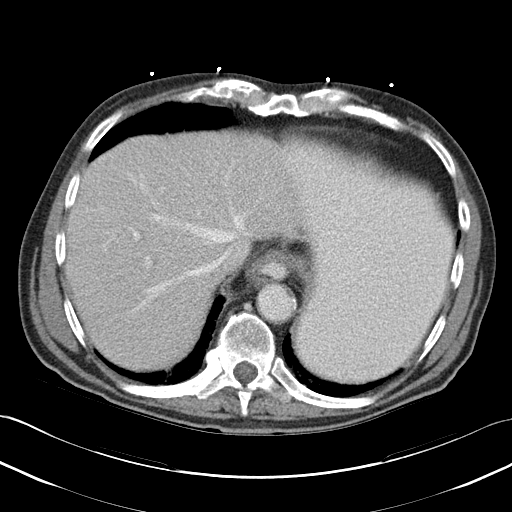
[im 100/105  soft-tissue]
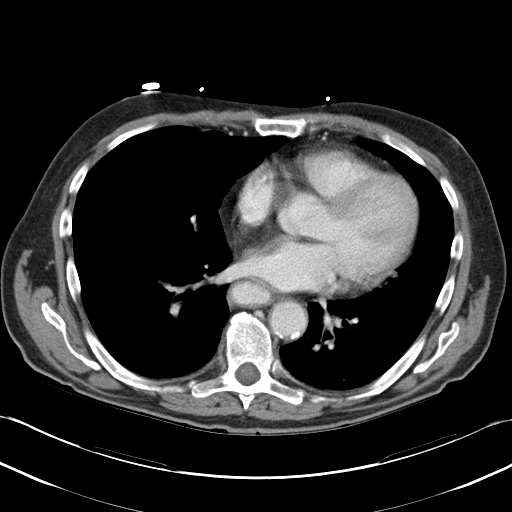

[Series 7: abd_pel_with 3.0 spo cor · coronal · 0.69mm/px · 3 of 90 slices shown]
[im 30/90  soft-tissue]
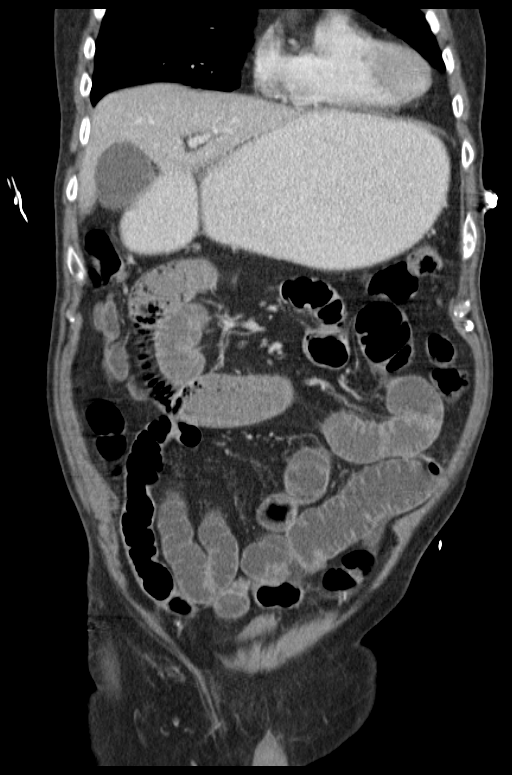
[im 40/90  soft-tissue]
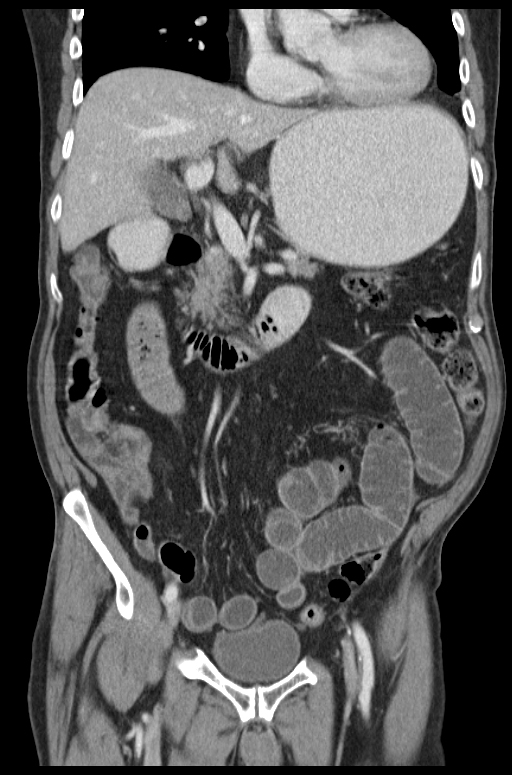
[im 50/90  soft-tissue]
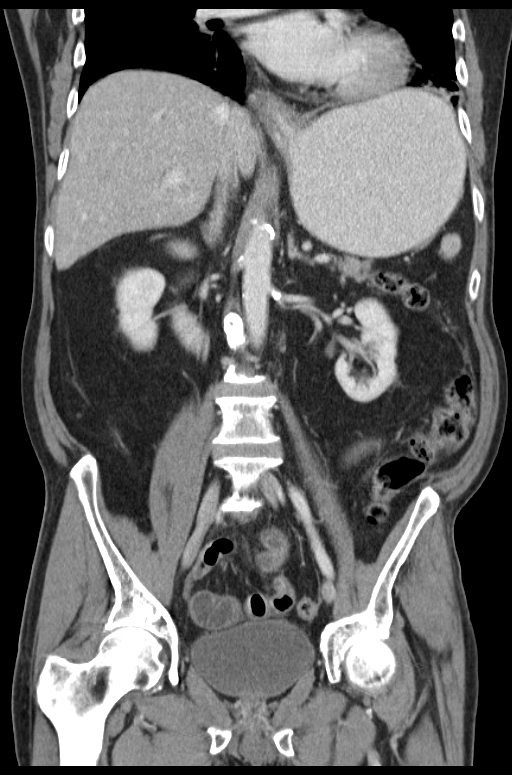

[15 of 46 positions shown; findings below may reference images not displayed]

FINDINGS: Lung bases are free of acute infiltrate or sizable effusion.
Atherosclerotic changes are noted in the descending thoracic aorta
as well as within the abdominal aorta.

The liver, gallbladder, spleen, pancreas and adrenal glands are
within normal limits. Kidneys are well visualized bilaterally and
reveal renal cystic change on the right. No calculi or obstructive
changes are seen.

There is significant distention of the stomach, jejunum and proximal
ileum. An area of relative caliber change is noted in the proximal
to mid ileum best seen on image number 82 of series 2. These changes
are likely related to a adhesions from patient's known prior surgery
as no definitive mass lesion is seen. The more distal ileum is fluid
filled but otherwise within normal limits. These changes are
consistent with a partial small bowel obstruction.

The bladder is well distended. No pelvic mass lesion or sidewall
abnormality is seen. A small amount of free pelvic fluid is noted.
Postsurgical changes are noted in the rectum. The osseous structures
show no acute abnormality. An intraosseous lipoma is noted in the
proximal left femoral neck.
IMPRESSION: Partial small bowel obstruction in the proximal to mid ileum likely
related to adhesions.

No other acute abnormality is seen.

## 2015-02-17 ENCOUNTER — Ambulatory Visit: Payer: Medicare Other | Admitting: Gastroenterology

## 2015-03-10 IMAGING — CR DG ABDOMEN 2V
2 series · 2 of 2 positions shown · non-contrast
Comparison: 02/02/2014 and 01/31/2014

CLINICAL DATA: Abdominal pain.  Constipation.

EXAM:
ABDOMEN - 2 VIEW

[view not recorded (1 of 2)]
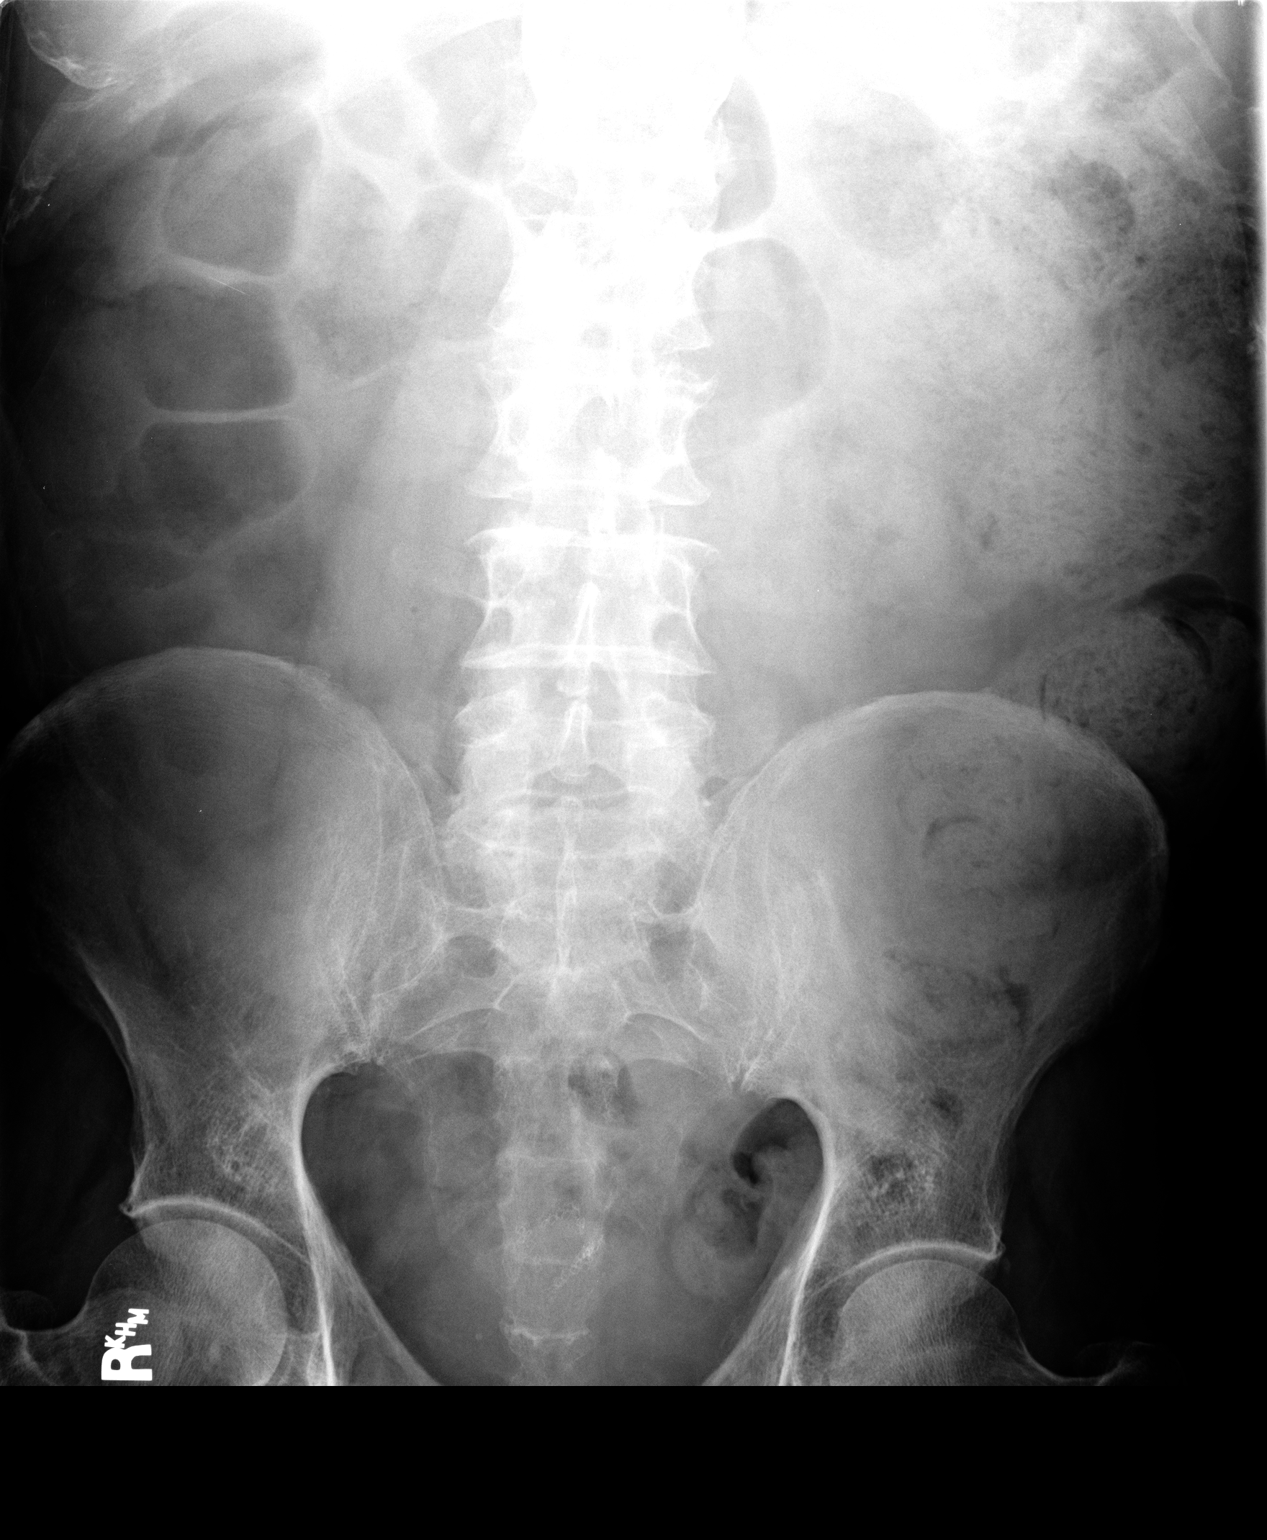

[view not recorded (2 of 2)]
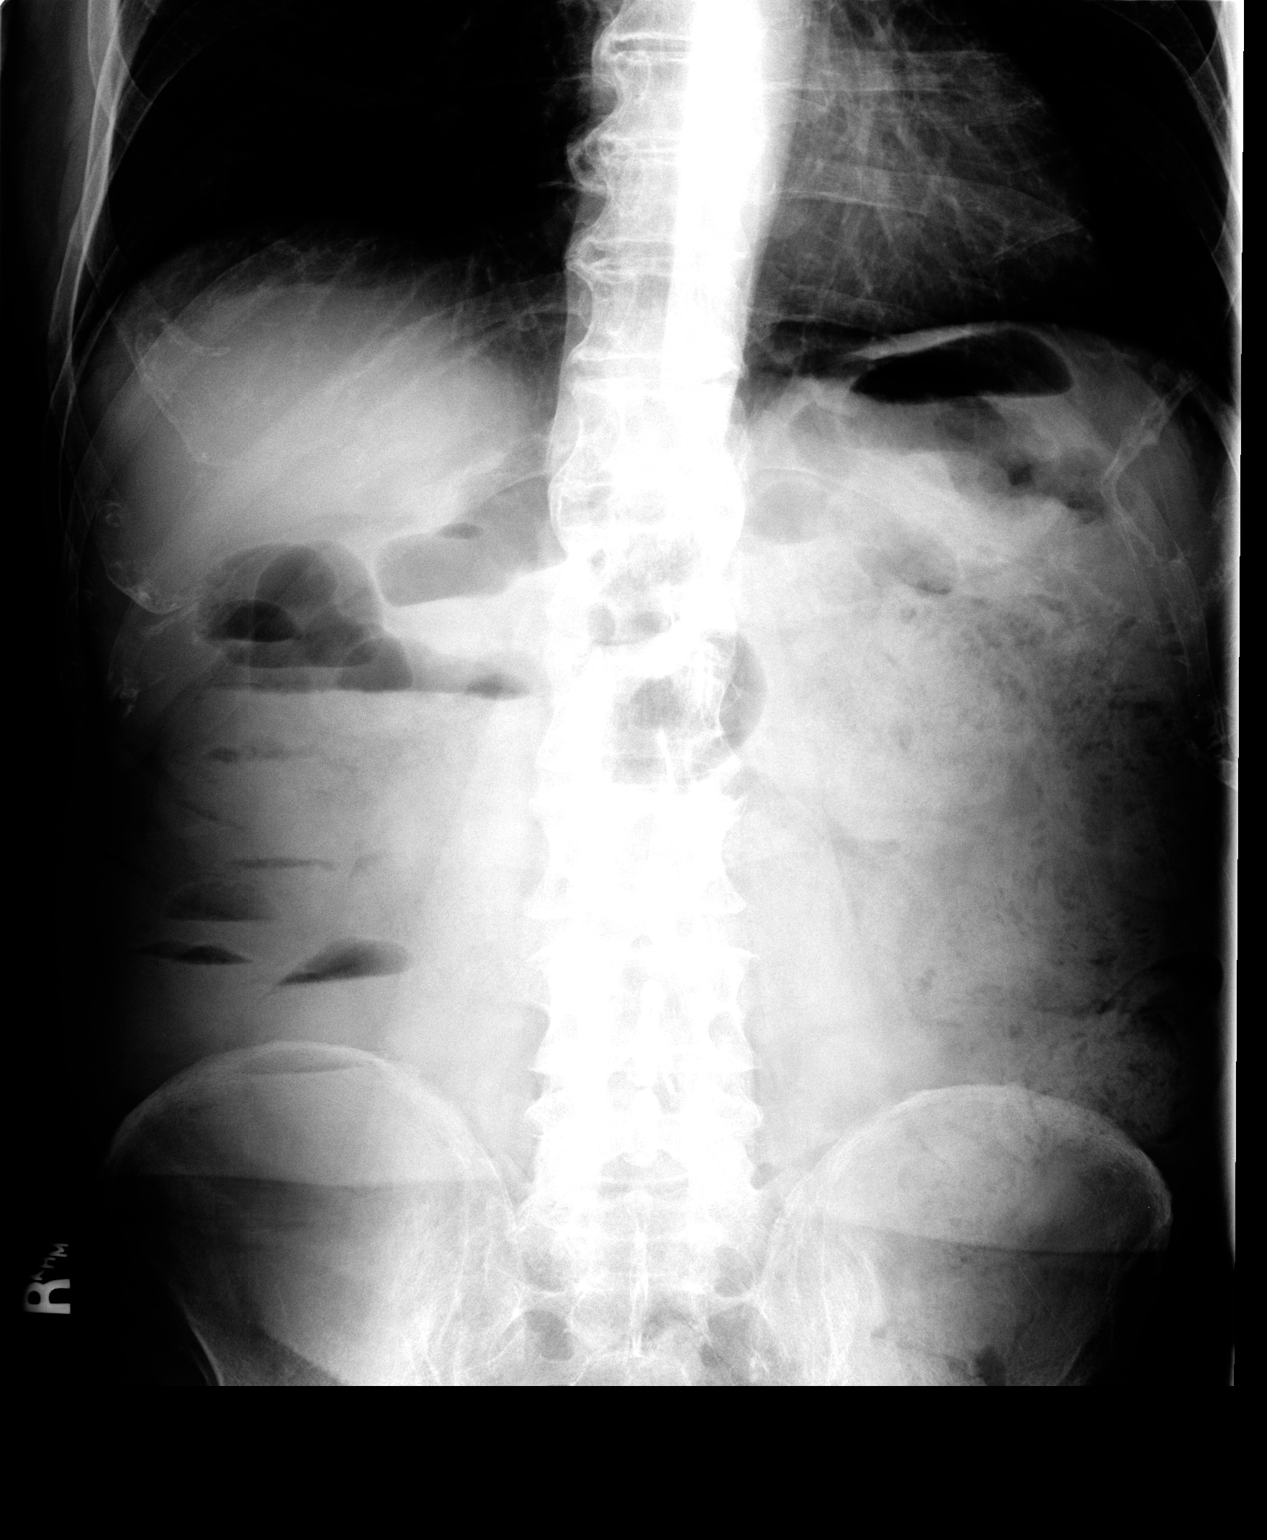

[2 of 2 positions shown; findings below may reference images not displayed]

FINDINGS: There is extensive stool in the left side of the colon but there is
no fecal impaction. There are air-fluid levels in the ascending
colon. There are no dilated small bowel loops. No worrisome
abdominal calcifications. Osseous structures are normal. No free
air.
IMPRESSION: Extensive stool  in the nondistended colon.

## 2017-01-26 DEATH — deceased
